# Patient Record
Sex: Female | Born: 1980 | Race: Black or African American | Hispanic: No | Marital: Married | Smoking: Never smoker
Health system: Southern US, Community
[De-identification: ages and names within clinical notes are randomized; demographics above are authoritative.]

## PROBLEM LIST (undated history)

## (undated) DIAGNOSIS — D071 Carcinoma in situ of vulva: Secondary | ICD-10-CM

## (undated) DIAGNOSIS — E669 Obesity, unspecified: Secondary | ICD-10-CM

## (undated) DIAGNOSIS — Z9109 Other allergy status, other than to drugs and biological substances: Secondary | ICD-10-CM

## (undated) DIAGNOSIS — D649 Anemia, unspecified: Secondary | ICD-10-CM

## (undated) DIAGNOSIS — G510 Bell's palsy: Secondary | ICD-10-CM

## (undated) DIAGNOSIS — F419 Anxiety disorder, unspecified: Secondary | ICD-10-CM

## (undated) HISTORY — PX: EYE SURGERY: SHX253

## (undated) HISTORY — DX: Obesity, unspecified: E66.9

## (undated) HISTORY — PX: VULVA /PERINEUM BIOPSY: SHX319

## (undated) HISTORY — DX: Carcinoma in situ of vulva: D07.1

---

## 2001-09-17 ENCOUNTER — Ambulatory Visit (HOSPITAL_COMMUNITY): Admission: RE | Admit: 2001-09-17 | Discharge: 2001-09-17 | Payer: Self-pay | Admitting: Ophthalmology

## 2003-11-22 ENCOUNTER — Emergency Department (HOSPITAL_COMMUNITY): Admission: EM | Admit: 2003-11-22 | Discharge: 2003-11-22 | Payer: Self-pay | Admitting: Family Medicine

## 2004-01-19 ENCOUNTER — Emergency Department (HOSPITAL_COMMUNITY): Admission: EM | Admit: 2004-01-19 | Discharge: 2004-01-19 | Payer: Self-pay | Admitting: Family Medicine

## 2004-03-09 ENCOUNTER — Emergency Department (HOSPITAL_COMMUNITY): Admission: EM | Admit: 2004-03-09 | Discharge: 2004-03-09 | Payer: Self-pay | Admitting: Family Medicine

## 2004-04-17 ENCOUNTER — Emergency Department (HOSPITAL_COMMUNITY): Admission: EM | Admit: 2004-04-17 | Discharge: 2004-04-17 | Payer: Self-pay | Admitting: Family Medicine

## 2005-11-27 ENCOUNTER — Emergency Department (HOSPITAL_COMMUNITY): Admission: EM | Admit: 2005-11-27 | Discharge: 2005-11-27 | Payer: Self-pay | Admitting: Emergency Medicine

## 2005-12-11 ENCOUNTER — Emergency Department (HOSPITAL_COMMUNITY): Admission: EM | Admit: 2005-12-11 | Discharge: 2005-12-11 | Payer: Self-pay | Admitting: Emergency Medicine

## 2006-09-17 ENCOUNTER — Emergency Department (HOSPITAL_COMMUNITY): Admission: EM | Admit: 2006-09-17 | Discharge: 2006-09-17 | Payer: Self-pay | Admitting: Emergency Medicine

## 2007-09-28 ENCOUNTER — Emergency Department (HOSPITAL_COMMUNITY): Admission: EM | Admit: 2007-09-28 | Discharge: 2007-09-28 | Payer: Self-pay | Admitting: Family Medicine

## 2011-01-18 DIAGNOSIS — Z0000 Encounter for general adult medical examination without abnormal findings: Secondary | ICD-10-CM | POA: Insufficient documentation

## 2012-07-26 DIAGNOSIS — N390 Urinary tract infection, site not specified: Secondary | ICD-10-CM | POA: Insufficient documentation

## 2013-05-25 ENCOUNTER — Ambulatory Visit: Payer: Self-pay | Admitting: Gynecologic Oncology

## 2013-06-07 ENCOUNTER — Ambulatory Visit: Payer: Self-pay | Admitting: Gynecologic Oncology

## 2013-06-28 ENCOUNTER — Inpatient Hospital Stay: Payer: Self-pay

## 2013-06-28 LAB — CBC WITH DIFFERENTIAL/PLATELET
BASOS PCT: 0.5 %
Basophil #: 0 10*3/uL (ref 0.0–0.1)
EOS PCT: 0.9 %
Eosinophil #: 0.1 10*3/uL (ref 0.0–0.7)
HCT: 34.6 % — AB (ref 35.0–47.0)
HGB: 11.5 g/dL — ABNORMAL LOW (ref 12.0–16.0)
LYMPHS PCT: 21.9 %
Lymphocyte #: 1.4 10*3/uL (ref 1.0–3.6)
MCH: 29.6 pg (ref 26.0–34.0)
MCHC: 33.4 g/dL (ref 32.0–36.0)
MCV: 89 fL (ref 80–100)
MONO ABS: 0.4 x10 3/mm (ref 0.2–0.9)
Monocyte %: 6.8 %
Neutrophil #: 4.5 10*3/uL (ref 1.4–6.5)
Neutrophil %: 69.9 %
Platelet: 242 10*3/uL (ref 150–440)
RBC: 3.89 10*6/uL (ref 3.80–5.20)
RDW: 15.3 % — AB (ref 11.5–14.5)
WBC: 6.5 10*3/uL (ref 3.6–11.0)

## 2013-06-30 LAB — CBC WITH DIFFERENTIAL/PLATELET
BASOS ABS: 0 10*3/uL (ref 0.0–0.1)
BASOS PCT: 0.3 %
EOS ABS: 0 10*3/uL (ref 0.0–0.7)
EOS PCT: 0.5 %
HCT: 34.4 % — AB (ref 35.0–47.0)
HGB: 11.3 g/dL — AB (ref 12.0–16.0)
LYMPHS ABS: 1.2 10*3/uL (ref 1.0–3.6)
Lymphocyte %: 16.2 %
MCH: 29.4 pg (ref 26.0–34.0)
MCHC: 32.7 g/dL (ref 32.0–36.0)
MCV: 90 fL (ref 80–100)
Monocyte #: 0.5 x10 3/mm (ref 0.2–0.9)
Monocyte %: 6.3 %
NEUTROS PCT: 76.7 %
Neutrophil #: 5.6 10*3/uL (ref 1.4–6.5)
PLATELETS: 223 10*3/uL (ref 150–440)
RBC: 3.84 10*6/uL (ref 3.80–5.20)
RDW: 14.9 % — AB (ref 11.5–14.5)
WBC: 7.3 10*3/uL (ref 3.6–11.0)

## 2013-07-02 LAB — HEMATOCRIT: HCT: 31.8 % — AB (ref 35.0–47.0)

## 2013-07-10 ENCOUNTER — Emergency Department: Payer: Self-pay | Admitting: Emergency Medicine

## 2013-07-10 LAB — URINALYSIS, COMPLETE
BILIRUBIN, UR: NEGATIVE
Glucose,UR: NEGATIVE mg/dL (ref 0–75)
Ketone: NEGATIVE
Nitrite: NEGATIVE
PROTEIN: NEGATIVE
Ph: 7 (ref 4.5–8.0)
SPECIFIC GRAVITY: 1.011 (ref 1.003–1.030)

## 2013-07-10 LAB — CBC
HCT: 37 % (ref 35.0–47.0)
HGB: 12.3 g/dL (ref 12.0–16.0)
MCH: 29.8 pg (ref 26.0–34.0)
MCHC: 33.3 g/dL (ref 32.0–36.0)
MCV: 89 fL (ref 80–100)
Platelet: 380 10*3/uL (ref 150–440)
RBC: 4.14 10*6/uL (ref 3.80–5.20)
RDW: 14.3 % (ref 11.5–14.5)
WBC: 5 10*3/uL (ref 3.6–11.0)

## 2013-07-10 LAB — COMPREHENSIVE METABOLIC PANEL
ALBUMIN: 2.9 g/dL — AB (ref 3.4–5.0)
ANION GAP: 7 (ref 7–16)
AST: 24 U/L (ref 15–37)
Alkaline Phosphatase: 105 U/L
BUN: 15 mg/dL (ref 7–18)
Bilirubin,Total: 0.3 mg/dL (ref 0.2–1.0)
CALCIUM: 9.3 mg/dL (ref 8.5–10.1)
CHLORIDE: 106 mmol/L (ref 98–107)
CO2: 28 mmol/L (ref 21–32)
CREATININE: 0.89 mg/dL (ref 0.60–1.30)
EGFR (Non-African Amer.): >60
Glucose: 82 mg/dL (ref 65–99)
OSMOLALITY: 281 (ref 275–301)
Potassium: 4.1 mmol/L (ref 3.5–5.1)
SGPT (ALT): 28 U/L (ref 12–78)
Sodium: 141 mmol/L (ref 136–145)
Total Protein: 7.5 g/dL (ref 6.4–8.2)

## 2013-11-28 ENCOUNTER — Encounter (HOSPITAL_COMMUNITY): Payer: Self-pay | Admitting: Emergency Medicine

## 2013-11-28 ENCOUNTER — Emergency Department (INDEPENDENT_AMBULATORY_CARE_PROVIDER_SITE_OTHER)
Admission: EM | Admit: 2013-11-28 | Discharge: 2013-11-28 | Disposition: A | Discharge status: Home / Self Care | Payer: 59 | Source: Home / Self Care

## 2013-11-28 DIAGNOSIS — L03113 Cellulitis of right upper limb: Secondary | ICD-10-CM

## 2013-11-28 MED ORDER — SULFAMETHOXAZOLE-TRIMETHOPRIM 800-160 MG PO TABS: 2.0000 | ORAL_TABLET | Freq: Two times a day (BID) | ORAL | Status: DC

## 2013-11-28 MED ORDER — FLUCONAZOLE 100 MG PO TABS: 100.0000 mg | ORAL_TABLET | Freq: Every day | ORAL | Status: DC

## 2013-11-28 MED ORDER — CEPHALEXIN 500 MG PO CAPS: 500.0000 mg | ORAL_CAPSULE | Freq: Four times a day (QID) | ORAL | Status: DC

## 2013-11-28 NOTE — ED Notes (Signed)
C/O RIGHT GREAT TOE PAIN AND SWELLING.  STATES STUBBED TOE ONE WEEK AGO AND STILL HAVING PAIN AND NOTICED CLEAR DRAINAGE FROM TOE AFTER CUTTING TOE NAIL.

## 2013-11-28 NOTE — Discharge Instructions (Signed)
Thank you for coming in today. Take Keflex 4 times daily for one week If not improving take Bactrim as well Take fluconazole if he develops yeast infection Come back as needed  Cellulitis Cellulitis is an infection of the skin and the tissue beneath it. The infected area is usually red and tender. Cellulitis occurs most often in the arms and lower legs.  CAUSES  Cellulitis is caused by bacteria that enter the skin through cracks or cuts in the skin. The most common types of bacteria that cause cellulitis are staphylococci and streptococci. SIGNS AND SYMPTOMS   Redness and warmth.  Swelling.  Tenderness or pain.  Fever. DIAGNOSIS  Your health care provider can usually determine what is wrong based on a physical exam. Blood tests may also be done. TREATMENT  Treatment usually involves taking an antibiotic medicine. HOME CARE INSTRUCTIONS   Take your antibiotic medicine as directed by your health care provider. Finish the antibiotic even if you start to feel better.  Keep the infected arm or leg elevated to reduce swelling.  Apply a warm cloth to the affected area up to 4 times per day to relieve pain.  Take medicines only as directed by your health care provider.  Keep all follow-up visits as directed by your health care provider. SEEK MEDICAL CARE IF:   You notice red streaks coming from the infected area.  Your red area gets larger or turns dark in color.  Your bone or joint underneath the infected area becomes painful after the skin has healed.  Your infection returns in the same area or another area.  You notice a swollen bump in the infected area.  You develop new symptoms.  You have a fever. SEEK IMMEDIATE MEDICAL CARE IF:   You feel very sleepy.  You develop vomiting or diarrhea.  You have a general ill feeling (malaise) with muscle aches and pains. MAKE SURE YOU:   Understand these instructions.  Will watch your condition.  Will get help right away  if you are not doing well or get worse. Document Released: 10/03/2004 Document Revised: 05/10/2013 Document Reviewed: 03/11/2011 [facility] Patient Information 2015 [employer], [place]. This information is not intended to replace advice given to you by your health care provider. Make sure you discuss any questions you have with your health care provider.

## 2013-11-28 NOTE — ED Provider Notes (Signed)
Catherine Montgomery is a 33 y.o. female who presents to Urgent Care today for right great toenail erythema and tenderness. One week ago patient stepped her right great toe. The last several days she's had redness and tenderness at the angle of cuticle on the lateral side. No fevers or chills. No drug allergies. Patient is currently breast-feeding.   History reviewed. No pertinent past medical history. History reviewed. No pertinent past surgical history. History  Substance Use Topics  . Smoking status: Never Smoker   . Smokeless tobacco: Not on file  . Alcohol Use: No   ROS as above Medications: No current facility-administered medications for this encounter.   Current Outpatient Prescriptions  Medication Sig Dispense Refill  . cephALEXin (KEFLEX) 500 MG capsule Take 1 capsule (500 mg total) by mouth 4 (four) times daily. 28 capsule 0  . fluconazole (DIFLUCAN) 100 MG tablet Take 1 tablet (100 mg total) by mouth daily. 10 tablet 0  . sulfamethoxazole-trimethoprim (SEPTRA DS) 800-160 MG per tablet Take 2 tablets by mouth 2 (two) times daily. 28 tablet 0   No Known Allergies   Exam:  BP 143/88 mmHg  Pulse 78  Temp(Src) 98.2 F (36.8 C) (Oral)  Resp 16  SpO2 98%  LMP  (LMP Unknown) Gen: Well NAD Right great toe:  Lateral border erythematous and tender. No discharge. No fluctuance. No results found for this or any previous visit (from the past 24 hour(s)). No results found.  Assessment and Plan: 33 y.o. female with early paronychia versus cellulitis. Treatment with Keflex. If not improved start Bactrim. Fluconazole for yeast as needed. Would like to avoid Bactrim if possible because patient is currently breast-feeding however will use his medication if needed.  Discussed warning signs or symptoms. Please see discharge instructions. Patient expresses understanding.     Rodolph BongEvan S Mercy Leppla, MD 11/28/13 [id number]

## 2014-04-30 NOTE — Op Note (Signed)
PATIENT NAME:  Catherine Montgomery, [name] MR#:  [id number] DATE OF BIRTH:  April 20, 1980  DATE OF PROCEDURE:  07/01/2013  PREOPERATIVE DIAGNOSES:  741. A 34 year old G1 at 40 weeks, 2 days gestation.  2. Failed induction for oligohydramnios.   POSTOPERATIVE DIAGNOSES:  401. A 34 year old G1 at 40 weeks, 2 days gestation.  2. Failed induction for oligohydramnios.   OPERATION PERFORMED: Primary low transverse C-section via Pfannenstiel skin incision.   ANESTHESIA USED:  Epidural.   PRIMARY SURGEON: Dr. Vena AustriaAndreas Hilman Kissling.   ASSISTANT:  Dr. Adria Devonarrie Klett.   ESTIMATED BLOOD LOSS: 800 mL.  OPERATIVE FLUIDS: 900 mL of crystalloid.   PREOPERATIVE ANTIBIOTICS: Two grams of Ancef.   DRAINS OR TUBES: Foley to gravity drainage, On-Q catheter system.   IMPLANTS: None.   SPECIMENS REMOVED: None.   COMPLICATIONS: None.   INTRAOPERATIVE FINDINGS: Normal uterus, tubes, and ovaries. Delivery resulted in the birth of a liveborn female infant weighing 3600 grams; 7 pounds, 15 ounces. Apgars 8 and 9. Patient condition following the procedure stable.   PROCEDURE IN DETAIL: Risks, benefits, and alternatives of the procedure were discussed with the patient prior to proceeding to the operating room. The patient was taken to the operating room where epidural anesthesia was dosed for C-section. The patient was positioned in the supine position, prepped and draped in the usual sterile fashion. A timeout was performed. The level of anesthetic was checked and noted to be adequate prior to proceeding with the case. A Pfannenstiel skin incision was made 2 cm above the pubic symphysis, carried down sharply to level of the rectus fascia utilizing a knife. The fascia was incised in the midline and then extended using Mayo scissors. The superior border of the rectus fascia was grasped with 2 Kocher clamps. The underlying rectus muscles were dissected off the fascia bluntly and the median raphe incised using Mayo scissors. The inferior  border of the rectus fascia was dissected off the rectus muscle in a similar fashion. The midline was identified and the peritoneal cavity was entered bluntly. The peritoneal incision was extended using manual traction on bladder. Blade was placed, displacing the bladder caudally. A low transverse incision was scored on the uterus and the uterus was entered bluntly using the operator's finger. The hysterotomy incision was extended using manual traction. Clear fluid was encountered. The operator's hand was placed into the hysterotomy incision. The infant was noted to be in the OA position. The vertex was grasped, flexed, brought to the incision, delivered atraumatically using fundal pressure. The remainder of the body delivered with ease. The infant was suctioned, cord was clamped and cut, and the infant was passed to the awaiting pediatricians. The placenta was delivered using manual extraction. The uterus was exteriorized, wiped clean of clots and debris using 2 moist laps. The hysterotomy incision was then closed using a 2-layer closure of 0 Vicryl with the 1st being a running locked, the 2nd a vertical imbricating. The uterus was returned to the abdomen.  Perineal gutters were wiped clean of clots and debris. Hysterotomy incision was reinspected, noted to be hemostatic. The rectus muscles were reapproximated in the midline using a 2-0 Vicryl mattress stitch. The On-Q catheters were then placed subfascially per the usual protocol. The fascia was closed using a looped #1 PDS in a running fashion. Subcutaneous tissue was irrigated.  The subcutaneous dead space was closed using a T53 needle of 2-0 chromic using interrupted sutures. Skin was closed using Insorb staples. The On-Q catheters were then bolused. Sponge, needle, and  instrument counts were correct x 2. The patient tolerated the procedure well and was taken to the recovery room in stable condition.    ____________________________ Florina Ou. Bonney Aid,  MD ams:dd D: 07/08/2013 16:04:06 ET T: 07/09/2013 02:25:59 ET JOB#: [id number]  cc: Florina Ou. Bonney Aid, MD, <Dictator> Lorrene Reid MD ELECTRONICALLY SIGNED 07/10/2013 8:08

## 2014-05-17 NOTE — H&P (Signed)
L&D Evaluation:  History Expanded:  HPI 34 yo G3P0020 at 39weeks 6days, with a previously unremarkable pregnancy who has been followed for the last few weeks with AFIs due to low fluid. 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 She also has been followed for VIN3. she HAS HAD LOW FLUID FOR TWO WEEKS, TODAY IT WAS 6 CM AND THE nst WAS REACTIVE. The fluid has been dropping slowly and given her due date the decision was made to induce.  She received her tdap 05/14/2013, she is BPOS/RUBI/VZI. she was diagnosed with anemia at 28 weeks. She is GBS neg. the wide local excision may be done PP depending on how the delivery goes.   Term 0   PreTerm 0   Abortion 2   Living 0   Blood Type (Maternal) B positive   Group B Strep Results Maternal (Result >5wks must be treated as unknown) negative   Maternal HIV Negative   Maternal Syphilis Ab Nonreactive   Maternal Varicella Immune   Rubella Results (Maternal) immune   Maternal [facility]Dap Immune   [facility]   Presents with OLIGOHYDRAMNIOS   Patient's Medical History No Chronic Illness  obesity   Patient's Surgical History none   Medications Pre Natal Vitamins   Allergies NKDA   Social History none   Family History Non-Contributory   Current Prenatal Course Notable For VIN 3, low flujid, obesity   ROS:  ROS All systems were reviewed.  HEENT, CNS, GI, GU, Respiratory, CV, Renal and Musculoskeletal systems were found to be normal.   Exam:  Vital Signs stable   Urine Protein not completed   General no apparent distress   Mental Status clear   Chest clear   Heart normal sinus rhythm   Abdomen gravid, non-tender   Estimated Fetal Weight Average for gestational age   Fetal Position v   Fundal Height term   Back no CVAT   Edema no edema   Pelvic no external lesions, very thin low uterine segmant, unable to reach the cervix as VERY posterior   Mebranes Intact   FHT normal  rate with no decels, CAT 1   Fetal Heart Rate 140   Ucx irregular   Skin dry   Lymph no lymphadenopathy   Impression:  Impression oligohydramnios   Plan:  Plan UA, EFM/NST   Comments start cytotec and then pitocin anticipate SVD   Follow Up Appointment already scheduled. in 6 weeks   Electronic Signatures: Adria DevonKlett, Carrie (MD)  (Signed 22-Jun-15 20:24)  Authored: L&D Evaluation   Last Updated: 22-Jun-15 20:24 by Adria DevonKlett, Carrie (MD)

## 2014-09-14 LAB — OB RESULTS CONSOLE HIV ANTIBODY (ROUTINE TESTING): HIV: NONREACTIVE

## 2014-09-14 LAB — OB RESULTS CONSOLE RPR: RPR: NONREACTIVE

## 2014-09-14 LAB — OB RESULTS CONSOLE PLATELET COUNT: PLATELETS: 307 10*3/uL

## 2014-09-14 LAB — OB RESULTS CONSOLE VARICELLA ZOSTER ANTIBODY, IGG: Varicella: IMMUNE

## 2014-09-14 LAB — OB RESULTS CONSOLE RUBELLA ANTIBODY, IGM: RUBELLA: IMMUNE

## 2014-09-14 LAB — OB RESULTS CONSOLE HEPATITIS B SURFACE ANTIGEN: Hepatitis B Surface Ag: NEGATIVE

## 2014-12-07 ENCOUNTER — Emergency Department
Admission: EM | Admit: 2014-12-07 | Discharge: 2014-12-07 | Disposition: A | Discharge status: Home / Self Care | Payer: 59 | Attending: Emergency Medicine | Admitting: Emergency Medicine

## 2014-12-07 DIAGNOSIS — O99612 Diseases of the digestive system complicating pregnancy, second trimester: Secondary | ICD-10-CM | POA: Diagnosis not present

## 2014-12-07 DIAGNOSIS — O210 Mild hyperemesis gravidarum: Secondary | ICD-10-CM | POA: Diagnosis present

## 2014-12-07 DIAGNOSIS — K529 Noninfective gastroenteritis and colitis, unspecified: Secondary | ICD-10-CM | POA: Insufficient documentation

## 2014-12-07 DIAGNOSIS — Z3A20 20 weeks gestation of pregnancy: Secondary | ICD-10-CM | POA: Diagnosis not present

## 2014-12-07 LAB — URINALYSIS COMPLETE WITH MICROSCOPIC (ARMC ONLY)
BILIRUBIN URINE: NEGATIVE
GLUCOSE, UA: NEGATIVE mg/dL
HGB URINE DIPSTICK: NEGATIVE
LEUKOCYTES UA: NEGATIVE
NITRITE: NEGATIVE
Protein, ur: NEGATIVE mg/dL
SPECIFIC GRAVITY, URINE: 1.018 (ref 1.005–1.030)
pH: 7 (ref 5.0–8.0)

## 2014-12-07 LAB — CBC
HCT: 34.9 % — ABNORMAL LOW (ref 35.0–47.0)
Hemoglobin: 11.6 g/dL — ABNORMAL LOW (ref 12.0–16.0)
MCH: 28.8 pg (ref 26.0–34.0)
MCHC: 33.3 g/dL (ref 32.0–36.0)
MCV: 86.6 fL (ref 80.0–100.0)
PLATELETS: 249 10*3/uL (ref 150–440)
RBC: 4.03 MIL/uL (ref 3.80–5.20)
RDW: 13.2 % (ref 11.5–14.5)
WBC: 7.8 10*3/uL (ref 3.6–11.0)

## 2014-12-07 LAB — BASIC METABOLIC PANEL
ANION GAP: 7 (ref 5–15)
BUN: 7 mg/dL (ref 6–20)
CALCIUM: 8.7 mg/dL — AB (ref 8.9–10.3)
CO2: 22 mmol/L (ref 22–32)
Chloride: 106 mmol/L (ref 101–111)
Creatinine, Ser: 0.52 mg/dL (ref 0.44–1.00)
GFR calc Af Amer: >60 mL/min (ref >60)
GLUCOSE: 106 mg/dL — AB (ref 65–99)
POTASSIUM: 3.2 mmol/L — AB (ref 3.5–5.1)
SODIUM: 135 mmol/L (ref 135–145)

## 2014-12-07 LAB — PREGNANCY, URINE: Preg Test, Ur: POSITIVE — AB

## 2014-12-07 MED ORDER — PROMETHAZINE HCL 25 MG PO TABS: 25.0000 mg | ORAL_TABLET | ORAL | Status: DC | PRN

## 2014-12-07 MED ORDER — SODIUM CHLORIDE 0.9 % IV SOLN: Freq: Once | INTRAVENOUS | Status: DC

## 2014-12-07 NOTE — Discharge Instructions (Signed)
Norovirus Infection °A norovirus infection is caused by exposure to a virus in a group of similar viruses (noroviruses). This type of infection causes inflammation in your stomach and intestines (gastroenteritis). Norovirus is the most common cause of gastroenteritis. It also causes food poisoning. °Anyone can get a norovirus infection. It spreads very easily (contagious). You can get it from contaminated food, water, surfaces, or other people. Norovirus is found in the stool or vomit of infected people. You can spread the infection as soon as you feel sick until 2 weeks after you recover.  °Symptoms usually begin within 2 days after you become infected. Most norovirus symptoms affect the digestive system. °CAUSES °Norovirus infection is caused by contact with norovirus. You can catch norovirus if you: °· Eat or drink something contaminated with norovirus. °· Touch surfaces or objects contaminated with norovirus and then put your hand in your mouth. °· Have direct contact with an infected person who has symptoms. °· Share food, drink, or utensils with someone with who is sick with norovirus. °SIGNS AND SYMPTOMS °Symptoms of norovirus may include: °· Nausea. °· Vomiting. °· Diarrhea. °· Stomach cramps. °· Fever. °· Chills. °· Headache. °· Muscle aches. °· Tiredness. °DIAGNOSIS °Your health care provider may suspect norovirus based on your symptoms and physical exam. Your health care provider may also test a sample of your stool or vomit for the virus.  °TREATMENT °There is no specific treatment for norovirus. Most people get better without treatment in about 2 days. °HOME CARE INSTRUCTIONS °· Replace lost fluids by drinking plenty of water or rehydration fluids containing important minerals called electrolytes. This prevents dehydration. Drink enough fluid to keep your urine clear or pale yellow. °· Do not prepare food for others while you are infected. Wait at least 3 days after recovering from the illness to do  that. °PREVENTION  °· Wash your hands often, especially after using the toilet or changing a diaper. °· Wash fruits and vegetables thoroughly before preparing or serving them. °· Throw out any food that a sick person may have touched. °· Disinfect contaminated surfaces immediately after someone in the household has been sick. Use a bleach-based household cleaner. °· Immediately remove and wash soiled clothes or sheets. °SEEK MEDICAL CARE IF: °· Your vomiting, diarrhea, and stomach pain is getting worse. °· Your symptoms of norovirus do not go away after 2-3 days. °SEEK IMMEDIATE MEDICAL CARE IF:  °You develop symptoms of dehydration that do not improve with fluid replacement. This may include: °· Excessive sleepiness. °· Lack of tears. °· Dry mouth. °· Dizziness when standing. °· Weak pulse. °  °This information is not intended to replace advice given to you by your health care provider. Make sure you discuss any questions you have with your health care provider. °  °Document Released: 03/16/2002 Document Revised: 01/14/2014 Document Reviewed: 06/03/2013 °Elsevier Interactive Patient Education ©2016 [employer] Inc. ° °

## 2014-12-07 NOTE — ED Notes (Signed)
Spoke with Dr. Inocencio HomesGayle about patient case.  Verbal orders received.

## 2014-12-07 NOTE — ED Provider Notes (Addendum)
[facility] Emergency Department Provider Note     Time seen: ----------------------------------------- 7:57 PM on 12/07/2014 -----------------------------------------    I have reviewed the triage vital signs and the nursing notes.   HISTORY  Chief Complaint Nausea and Diarrhea    HPI HASANA ALCORTA is a 34 y.o. female who presents ER for nausea vomiting diarrhea since yesterday. Patient was complaining some low back pain as well, thought she might be dehydrated. She was sent from [facility] to receive IV fluids. Patient states she was told earlier she had a fever, but not one currently. Denies any abdominal pain.Patient states she is [redacted] weeks pregnant and she said no sound today that was normal.   History reviewed. No pertinent past medical history.  There are no active problems to display for this patient.   History reviewed. No pertinent past surgical history.  Allergies Review of patient's allergies indicates no known allergies.  Social History Social History  Substance Use Topics  . Smoking status: Never Smoker   . Smokeless tobacco: None  . Alcohol Use: No    Review of Systems Constitutional: Positive for fever Eyes: Negative for visual changes. ENT: Negative for sore throat. Cardiovascular: Negative for chest pain. Respiratory: Negative for shortness of breath. Gastrointestinal: Positive for abdominal pain, vomiting and diarrhea Genitourinary: Negative for dysuria. Musculoskeletal: Negative for back pain. Skin: Negative for rash. Neurological: Negative for headaches, focal weakness or numbness.  10-point ROS otherwise negative.  ____________________________________________   PHYSICAL EXAM:  VITAL SIGNS: ED Triage Vitals  Enc Vitals Group     BP 12/07/14 1744 116/74 mmHg     Pulse Rate 12/07/14 1744 107     Resp 12/07/14 1744 16     Temp 12/07/14 1744 99.4 F (37.4 C)     Temp Source 12/07/14 1744 Oral   SpO2 12/07/14 1744 99 %     Weight 12/07/14 1744 240 lb (108.863 kg)     Height 12/07/14 1744  (1.727 m)     Head Cir --      Peak Flow --      Pain Score 12/07/14 1744 6     Pain Loc --      Pain Edu? --      Excl. in GC? --     Constitutional: Alert and oriented. Well appearing and in no distress. Eyes: Conjunctivae are normal. PERRL. Normal extraocular movements. ENT   Head: Normocephalic and atraumatic.   Nose: No congestion/rhinnorhea.   Mouth/Throat: Mucous membranes are moist.   Neck: No stridor. Cardiovascular: Normal rate, regular rhythm. Normal and symmetric distal pulses are present in all extremities. No murmurs, rubs, or gallops. Respiratory: Normal respiratory effort without tachypnea nor retractions. Breath sounds are clear and equal bilaterally. No wheezes/rales/rhonchi. Gastrointestinal: Soft and nontender. No distention. No abdominal bruits.  Musculoskeletal: Nontender with normal range of motion in all extremities. No joint effusions.  No lower extremity tenderness nor edema. Neurologic:  Normal speech and language. No gross focal neurologic deficits are appreciated. Speech is normal. No gait instability. Skin:  Skin is warm, dry and intact. No rash noted. Psychiatric: Mood and affect are normal. Speech and behavior are normal. Patient exhibits appropriate insight and judgment. ____________________________________________  ED COURSE:  Pertinent labs & imaging results that were available during my care of the patient were reviewed by me and considered in my medical decision making (see chart for details). Patient is in no acute distress, will receive IV fluids basic labs. ____________________________________________  LABS (pertinent positives/negatives)  Labs Reviewed  CBC - Abnormal; Notable for the following:    Hemoglobin 11.6 (*)    HCT 34.9 (*)    All other components within normal limits  BASIC METABOLIC PANEL - Abnormal; Notable for  the following:    Potassium 3.2 (*)    Glucose, Bld 106 (*)    Calcium 8.7 (*)    All other components within normal limits  URINALYSIS COMPLETEWITH MICROSCOPIC ([facility] ONLY) - Abnormal; Notable for the following:    Color, Urine YELLOW (*)    APPearance HAZY (*)    Ketones, ur 2+ (*)    Bacteria, UA RARE (*)    Squamous Epithelial / LPF 0-5 (*)    All other components within normal limits  PREGNANCY, URINE - Abnormal; Notable for the following:    Preg Test, Ur POSITIVE (*)    All other components within normal limits   ____________________________________________  FINAL ASSESSMENT AND PLAN  Gastroenteritis, second trimester pregnancy  Plan: Patient with labs and imaging as dictated above. Patient has received IV fluids and antiemetics. Patient currently feeling better, stable for discharge with oral antiemetics. Urine does not look infected, she'll be discharged with Phenergan to take as needed   Emily FilbertWilliams, Jonathan E, MD   Emily FilbertJonathan E Williams, MD 12/07/14 1958  Emily FilbertJonathan E Williams, MD 12/07/14 2147

## 2014-12-07 NOTE — ED Notes (Signed)
a 

## 2014-12-07 NOTE — ED Notes (Signed)
Patient comes with complaints of nausea, vomiting, and diarrhea since yesterday.

## 2014-12-07 NOTE — ED Notes (Signed)
Patient discharged to home per MD order. Patient in stable condition, and deemed medically cleared by ED provider for discharge. Discharge instructions reviewed with patient/family using "Teach Back"; verbalized understanding of medication education and administration, and information about follow-up care. Denies further concerns. ° °

## 2014-12-07 NOTE — ED Notes (Signed)
Pt in with co n.v.d since yest, also having dysuria, pt able to drink fluids at this time.

## 2015-04-01 LAB — OB RESULTS CONSOLE GBS: GBS: NEGATIVE

## 2015-04-17 ENCOUNTER — Encounter
Admission: RE | Admit: 2015-04-17 | Discharge: 2015-04-17 | Disposition: A | Discharge status: Home / Self Care | Payer: 59 | Source: Ambulatory Visit | Attending: Obstetrics and Gynecology | Admitting: Obstetrics and Gynecology

## 2015-04-17 HISTORY — DX: Bell's palsy: G51.0

## 2015-04-17 HISTORY — DX: Anxiety disorder, unspecified: F41.9

## 2015-04-17 HISTORY — DX: Anemia, unspecified: D64.9

## 2015-04-17 HISTORY — DX: Other allergy status, other than to drugs and biological substances: Z91.09

## 2015-04-17 LAB — CBC
HCT: 32.9 % — ABNORMAL LOW (ref 35.0–47.0)
HEMOGLOBIN: 11 g/dL — AB (ref 12.0–16.0)
MCH: 28.9 pg (ref 26.0–34.0)
MCHC: 33.5 g/dL (ref 32.0–36.0)
MCV: 86.2 fL (ref 80.0–100.0)
Platelets: 213 10*3/uL (ref 150–440)
RBC: 3.81 MIL/uL (ref 3.80–5.20)
RDW: 14.3 % (ref 11.5–14.5)
WBC: 5.3 10*3/uL (ref 3.6–11.0)

## 2015-04-17 LAB — TYPE AND SCREEN
ABO/RH(D): B POS
ANTIBODY SCREEN: NEGATIVE
EXTEND SAMPLE REASON: UNDETERMINED

## 2015-04-17 LAB — RAPID HIV SCREEN (HIV 1/2 AB+AG)
HIV 1/2 ANTIBODIES: NONREACTIVE
HIV-1 P24 ANTIGEN - HIV24: NONREACTIVE

## 2015-04-17 NOTE — Patient Instructions (Signed)
  Your procedure is scheduled on: 04/18/15 5:30 am Report to 3rd floor labor and delivery.   Remember: Instructions that are not followed completely may result in serious medical risk, up to and including death, or upon the discretion of your surgeon and anesthesiologist your surgery may need to be rescheduled.    __x__ 1. Do not eat food or drink liquids after midnight. No gum chewing or hard candies.     ____ 2. No Alcohol for 24 hours before or after surgery.   ____ 3. Bring all medications with you on the day of surgery if instructed.    __x__ 4. Notify your doctor if there is any change in your medical condition     (cold, fever, infections).     Do not wear jewelry, make-up, hairpins, clips or nail polish.  Do not wear lotions, powders, or perfumes. You may wear deodorant.  Do not shave 48 hours prior to surgery. Men may shave face and neck.  Do not bring valuables to the hospital.    [facility] belongings or valuables.               Contacts, dentures or bridgework may not be worn into surgery.  Leave your suitcase in the car. After surgery it may be brought to your room.  For patients admitted to the hospital, discharge time is determined by your                treatment team.   Patients discharged the day of surgery will not be allowed to drive home.   Please read over the following fact sheets that you were given:      ____ Take these medicines the morning of surgery with A SIP OF WATER:    1. None  2.   3.   4.  5.  6.  ____ Fleet Enema (as directed)   _x___ Use CHG Wipes as directed  ____ Use inhalers on the day of surgery  ____ Stop metformin 2 days prior to surgery    ____ Take 1/2 of usual insulin dose the night before surgery and none on the morning of surgery.   ____ Stop Coumadin/Plavix/aspirin on   ____ Stop Anti-inflammatories on    ____ Stop supplements until after surgery.    ____ Bring C-Pap to the hospital.

## 2015-04-18 ENCOUNTER — Inpatient Hospital Stay
Admission: RE | Admit: 2015-04-18 | Discharge: 2015-04-21 | DRG: 765 | Disposition: A | Discharge status: Home / Self Care | Payer: 59 | Source: Ambulatory Visit | Attending: Obstetrics and Gynecology | Admitting: Obstetrics and Gynecology

## 2015-04-18 ENCOUNTER — Inpatient Hospital Stay: Payer: 59 | Admitting: Anesthesiology

## 2015-04-18 ENCOUNTER — Encounter: Payer: Self-pay | Admitting: *Deleted

## 2015-04-18 ENCOUNTER — Encounter
Admission: RE | Disposition: A | Discharge status: Home / Self Care | Payer: Self-pay | Source: Ambulatory Visit | Attending: Obstetrics and Gynecology

## 2015-04-18 DIAGNOSIS — Z6839 Body mass index (BMI) 39.0-39.9, adult: Secondary | ICD-10-CM

## 2015-04-18 DIAGNOSIS — Z3A39 39 weeks gestation of pregnancy: Secondary | ICD-10-CM

## 2015-04-18 DIAGNOSIS — E669 Obesity, unspecified: Secondary | ICD-10-CM | POA: Diagnosis present

## 2015-04-18 DIAGNOSIS — O34211 Maternal care for low transverse scar from previous cesarean delivery: Principal | ICD-10-CM | POA: Diagnosis present

## 2015-04-18 DIAGNOSIS — O9081 Anemia of the puerperium: Secondary | ICD-10-CM | POA: Diagnosis not present

## 2015-04-18 DIAGNOSIS — O99344 Other mental disorders complicating childbirth: Secondary | ICD-10-CM | POA: Diagnosis present

## 2015-04-18 DIAGNOSIS — D62 Acute posthemorrhagic anemia: Secondary | ICD-10-CM | POA: Diagnosis not present

## 2015-04-18 DIAGNOSIS — F419 Anxiety disorder, unspecified: Secondary | ICD-10-CM | POA: Diagnosis present

## 2015-04-18 DIAGNOSIS — Z98891 History of uterine scar from previous surgery: Secondary | ICD-10-CM

## 2015-04-18 DIAGNOSIS — O99214 Obesity complicating childbirth: Secondary | ICD-10-CM | POA: Diagnosis present

## 2015-04-18 LAB — ABO/RH: ABO/RH(D): B POS

## 2015-04-18 LAB — RPR: RPR Ser Ql: NONREACTIVE

## 2015-04-18 SURGERY — Surgical Case
Anesthesia: Spinal

## 2015-04-18 MED ORDER — LACTATED RINGERS IV SOLN
INTRAVENOUS | Status: DC
  Administered 2015-04-19: 02:00:00 via INTRAVENOUS

## 2015-04-18 MED ORDER — OXYTOCIN 10 UNIT/ML IJ SOLN
2.5000 [IU]/h | INTRAVENOUS | Status: DC
  Filled 2015-04-18: qty 4

## 2015-04-18 MED ORDER — SENNOSIDES-DOCUSATE SODIUM 8.6-50 MG PO TABS
2.0000 | ORAL_TABLET | ORAL | Status: DC
  Administered 2015-04-20 – 2015-04-21 (×2): 2 via ORAL
  Filled 2015-04-18 (×2): qty 2

## 2015-04-18 MED ORDER — NALBUPHINE HCL 10 MG/ML IJ SOLN: 5.0000 mg | INTRAMUSCULAR | Status: DC | PRN

## 2015-04-18 MED ORDER — ONDANSETRON HCL 4 MG/2ML IJ SOLN: 4.0000 mg | Freq: Three times a day (TID) | INTRAMUSCULAR | Status: DC | PRN

## 2015-04-18 MED ORDER — METOCLOPRAMIDE HCL 5 MG/ML IJ SOLN
INTRAMUSCULAR | Status: DC | PRN
  Administered 2015-04-18: 10 mg via INTRAVENOUS

## 2015-04-18 MED ORDER — CITRIC ACID-SODIUM CITRATE 334-500 MG/5ML PO SOLN
30.0000 mL | ORAL | Status: AC
  Administered 2015-04-18: 30 mL via ORAL
  Filled 2015-04-18: qty 15

## 2015-04-18 MED ORDER — BUPIVACAINE HCL (PF) 0.5 % IJ SOLN
5.0000 mL | Freq: Once | INTRAMUSCULAR | Status: DC
  Filled 2015-04-18: qty 30

## 2015-04-18 MED ORDER — ONDANSETRON HCL 4 MG/2ML IJ SOLN
INTRAMUSCULAR | Status: DC | PRN
Start: 2015-04-18 — End: 2015-04-18
  Administered 2015-04-18: 4 mg via INTRAVENOUS

## 2015-04-18 MED ORDER — SIMETHICONE 80 MG PO CHEW
80.0000 mg | CHEWABLE_TABLET | Freq: Three times a day (TID) | ORAL | Status: DC
  Administered 2015-04-18 – 2015-04-21 (×9): 80 mg via ORAL
  Filled 2015-04-18 (×9): qty 1

## 2015-04-18 MED ORDER — FENTANYL CITRATE (PF) 100 MCG/2ML IJ SOLN
INTRAMUSCULAR | Status: DC | PRN
  Administered 2015-04-18: 50 ug via INTRAVENOUS

## 2015-04-18 MED ORDER — SODIUM CHLORIDE 0.9% FLUSH: 3.0000 mL | INTRAVENOUS | Status: DC | PRN

## 2015-04-18 MED ORDER — DIPHENHYDRAMINE HCL 25 MG PO CAPS: 25.0000 mg | ORAL_CAPSULE | Freq: Four times a day (QID) | ORAL | Status: DC | PRN

## 2015-04-18 MED ORDER — MEPERIDINE HCL 25 MG/ML IJ SOLN: 6.2500 mg | INTRAMUSCULAR | Status: DC | PRN

## 2015-04-18 MED ORDER — LACTATED RINGERS IV SOLN
INTRAVENOUS | Status: DC
  Administered 2015-04-18 (×2): via INTRAVENOUS

## 2015-04-18 MED ORDER — CEFAZOLIN SODIUM-DEXTROSE 2-4 GM/100ML-% IV SOLN
2.0000 g | INTRAVENOUS | Status: AC
  Administered 2015-04-18: 2 g via INTRAVENOUS
  Filled 2015-04-18: qty 100

## 2015-04-18 MED ORDER — KETOROLAC TROMETHAMINE 30 MG/ML IJ SOLN
30.0000 mg | Freq: Four times a day (QID) | INTRAMUSCULAR | Status: AC | PRN
  Administered 2015-04-18 (×2): 30 mg via INTRAVENOUS
  Filled 2015-04-18 (×3): qty 1

## 2015-04-18 MED ORDER — NALBUPHINE HCL 10 MG/ML IJ SOLN: 5.0000 mg | Freq: Once | INTRAMUSCULAR | Status: DC | PRN

## 2015-04-18 MED ORDER — KETOROLAC TROMETHAMINE 60 MG/2ML IM SOLN
INTRAMUSCULAR | Status: DC | PRN
  Administered 2015-04-18: 30 mg via INTRAMUSCULAR

## 2015-04-18 MED ORDER — FENTANYL CITRATE (PF) 100 MCG/2ML IJ SOLN: 25.0000 ug | INTRAMUSCULAR | Status: DC | PRN

## 2015-04-18 MED ORDER — MORPHINE SULFATE (PF) 0.5 MG/ML IJ SOLN
INTRAMUSCULAR | Status: DC | PRN
  Administered 2015-04-18: 200 ug via EPIDURAL

## 2015-04-18 MED ORDER — OXYTOCIN 40 UNITS IN LACTATED RINGERS INFUSION - SIMPLE MED
INTRAVENOUS | Status: AC
  Filled 2015-04-18: qty 1000

## 2015-04-18 MED ORDER — BUPIVACAINE HCL (PF) 0.75 % IJ SOLN
INTRAMUSCULAR | Status: DC | PRN
  Administered 2015-04-18: 15 mg via INTRATHECAL

## 2015-04-18 MED ORDER — BUPIVACAINE HCL (PF) 0.5 % IJ SOLN: 5.0000 mL | Freq: Once | INTRAMUSCULAR | Status: DC

## 2015-04-18 MED ORDER — LANOLIN HYDROUS EX OINT: 1.0000 "application " | TOPICAL_OINTMENT | CUTANEOUS | Status: DC | PRN

## 2015-04-18 MED ORDER — ONDANSETRON HCL 4 MG/2ML IJ SOLN: 4.0000 mg | Freq: Once | INTRAMUSCULAR | Status: DC | PRN

## 2015-04-18 MED ORDER — BUPIVACAINE ON-Q PAIN PUMP (FOR ORDER SET NO CHG)
INJECTION | Status: DC
  Filled 2015-04-18: qty 1

## 2015-04-18 MED ORDER — OXYCODONE HCL 5 MG PO TABS
10.0000 mg | ORAL_TABLET | ORAL | Status: DC | PRN
  Administered 2015-04-19 – 2015-04-21 (×6): 10 mg via ORAL
  Filled 2015-04-18 (×6): qty 2

## 2015-04-18 MED ORDER — IBUPROFEN 600 MG PO TABS
600.0000 mg | ORAL_TABLET | Freq: Four times a day (QID) | ORAL | Status: DC
  Administered 2015-04-18 – 2015-04-21 (×11): 600 mg via ORAL
  Filled 2015-04-18 (×10): qty 1

## 2015-04-18 MED ORDER — OXYTOCIN 40 UNITS IN LACTATED RINGERS INFUSION - SIMPLE MED
INTRAVENOUS | Status: AC
  Administered 2015-04-18: 1000 mL via INTRAVENOUS
  Filled 2015-04-18: qty 1000

## 2015-04-18 MED ORDER — KETOROLAC TROMETHAMINE 30 MG/ML IJ SOLN
30.0000 mg | Freq: Four times a day (QID) | INTRAMUSCULAR | Status: AC | PRN
  Filled 2015-04-18: qty 1

## 2015-04-18 MED ORDER — PRENATAL MULTIVITAMIN CH
1.0000 | ORAL_TABLET | Freq: Every day | ORAL | Status: DC
  Administered 2015-04-19 – 2015-04-21 (×3): 1 via ORAL
  Filled 2015-04-18 (×3): qty 1

## 2015-04-18 MED ORDER — NALOXONE HCL 2 MG/2ML IJ SOSY
1.0000 ug/kg/h | PREFILLED_SYRINGE | INTRAVENOUS | Status: DC | PRN
  Filled 2015-04-18: qty 2

## 2015-04-18 MED ORDER — DEXAMETHASONE SODIUM PHOSPHATE 10 MG/ML IJ SOLN
INTRAMUSCULAR | Status: DC | PRN
  Administered 2015-04-18: 10 mg via INTRAVENOUS

## 2015-04-18 MED ORDER — BUPIVACAINE HCL 0.5 % IJ SOLN
INTRAMUSCULAR | Status: DC | PRN
  Administered 2015-04-18: 10 mL

## 2015-04-18 MED ORDER — LACTATED RINGERS IV SOLN: INTRAVENOUS | Status: DC

## 2015-04-18 MED ORDER — MENTHOL 3 MG MT LOZG
1.0000 | LOZENGE | OROMUCOSAL | Status: DC | PRN
  Filled 2015-04-18: qty 9

## 2015-04-18 MED ORDER — OXYCODONE HCL 5 MG PO TABS
5.0000 mg | ORAL_TABLET | ORAL | Status: DC | PRN
  Administered 2015-04-20 (×2): 5 mg via ORAL
  Filled 2015-04-18 (×2): qty 1

## 2015-04-18 MED ORDER — BUPIVACAINE 0.25 % ON-Q PUMP DUAL CATH 400 ML
400.0000 mL | INJECTION | Status: DC
  Filled 2015-04-18: qty 400

## 2015-04-18 MED ORDER — FERROUS SULFATE 325 (65 FE) MG PO TABS
325.0000 mg | ORAL_TABLET | Freq: Two times a day (BID) | ORAL | Status: DC
  Administered 2015-04-18 – 2015-04-21 (×7): 325 mg via ORAL
  Filled 2015-04-18 (×7): qty 1

## 2015-04-18 MED ORDER — DIBUCAINE 1 % RE OINT: 1.0000 "application " | TOPICAL_OINTMENT | RECTAL | Status: DC | PRN

## 2015-04-18 MED ORDER — PHENYLEPHRINE HCL 10 MG/ML IJ SOLN
INTRAMUSCULAR | Status: DC | PRN
  Administered 2015-04-18 (×4): 100 ug via INTRAVENOUS

## 2015-04-18 MED ORDER — WITCH HAZEL-GLYCERIN EX PADS: 1.0000 "application " | MEDICATED_PAD | CUTANEOUS | Status: DC | PRN

## 2015-04-18 MED ORDER — NALOXONE HCL 0.4 MG/ML IJ SOLN: 0.4000 mg | INTRAMUSCULAR | Status: DC | PRN

## 2015-04-18 MED ORDER — IBUPROFEN 600 MG PO TABS
ORAL_TABLET | ORAL | Status: AC
  Administered 2015-04-18: 600 mg via ORAL
  Filled 2015-04-18: qty 1

## 2015-04-18 MED ORDER — SCOPOLAMINE 1 MG/3DAYS TD PT72: 1.0000 | MEDICATED_PATCH | Freq: Once | TRANSDERMAL | Status: DC

## 2015-04-18 SURGICAL SUPPLY — 30 items
CANISTER SUCT 3000ML (MISCELLANEOUS) ×3 IMPLANT
CATH KIT ON-Q SILVERSOAK 5 (CATHETERS) ×2 IMPLANT
CATH KIT ON-Q SILVERSOAK 5IN (CATHETERS) ×6 IMPLANT
CLOSURE WOUND 1/2 X4 (GAUZE/BANDAGES/DRESSINGS) ×1
DRSG TELFA 3X8 NADH (GAUZE/BANDAGES/DRESSINGS) ×3 IMPLANT
ELECT CAUTERY BLADE 6.4 (BLADE) ×3 IMPLANT
ELECT REM PT RETURN 9FT ADLT (ELECTROSURGICAL)
ELECTRODE REM PT RTRN 9FT ADLT (ELECTROSURGICAL) ×1 IMPLANT
GAUZE SPONGE 4X4 12PLY STRL (GAUZE/BANDAGES/DRESSINGS) ×3 IMPLANT
GLOVE BIO SURGEON STRL SZ7 (GLOVE) ×9 IMPLANT
GLOVE INDICATOR 7.5 STRL GRN (GLOVE) ×9 IMPLANT
GOWN STRL REUS W/ TWL LRG LVL3 (GOWN DISPOSABLE) ×3 IMPLANT
GOWN STRL REUS W/TWL LRG LVL3 (GOWN DISPOSABLE) ×9
LIQUID BAND (GAUZE/BANDAGES/DRESSINGS) ×3 IMPLANT
NS IRRIG 1000ML POUR BTL (IV SOLUTION) ×3 IMPLANT
PACK C SECTION AR (MISCELLANEOUS) ×3 IMPLANT
PAD DRESSING TELFA 3X8 NADH (GAUZE/BANDAGES/DRESSINGS) ×1 IMPLANT
PAD OB MATERNITY 4.3X12.25 (PERSONAL CARE ITEMS) ×6 IMPLANT
PAD PREP 24X41 OB/GYN DISP (PERSONAL CARE ITEMS) ×3 IMPLANT
SPONGE LAP 18X18 5 PK (GAUZE/BANDAGES/DRESSINGS) ×6 IMPLANT
STRIP CLOSURE SKIN 1/2X4 (GAUZE/BANDAGES/DRESSINGS) ×2 IMPLANT
SUT CHROMIC GUT BROWN 0 54 (SUTURE) ×1 IMPLANT
SUT CHROMIC GUT BROWN 0 54IN (SUTURE)
SUT MNCRL 4-0 (SUTURE) ×3
SUT MNCRL 4-0 27XMFL (SUTURE) ×1
SUT PDS AB 1 TP1 96 (SUTURE) ×3 IMPLANT
SUT PLAIN 2 0 XLH (SUTURE) ×1 IMPLANT
SUT VIC AB 0 CT1 36 (SUTURE) ×12 IMPLANT
SUTURE MNCRL 4-0 27XMF (SUTURE) ×1 IMPLANT
SWABSTK COMLB BENZOIN TINCTURE (MISCELLANEOUS) ×3 IMPLANT

## 2015-04-18 NOTE — Op Note (Signed)
Cesarean Section Procedure Note   Catherine Montgomery   04/18/2015   Pre-operative Diagnosis:  term pregnancy,prior c-section - desires repeat  Post-operative Diagnosis:  term pregnancy,prior c-section - desires repeat  Procedure: repeat low-transverse cesarean section via Pfannenstiel incision with double-layer uterine closure  Surgeon: Surgeon(s) and Role:    * Conard Novak, MD - Primary    * [place] Bing, MD - Assisting   Anesthesia: spinal   Findings:  1) normal appearing gravid uterus, fallopian tubes, and ovaries 2) viable female infant   Estimated Blood Loss: 750 mL  Total IV Fluids: 1,500 ml crystalloid  Specimens: none  Complications: no complications  Disposition: PACU - hemodynamically stable.   Maternal Condition: stable   Baby condition / location:  Couplet care / Skin to Skin  Procedure Details:  The patient was seen in the Holding Room. The risks, benefits, complications, treatment options, and expected outcomes were discussed with the patient. The patient concurred with the proposed plan, giving informed consent. identified as Catherine Montgomery and the procedure verified as C-Section Delivery. A Time Out was held and the above information confirmed.   After induction of anesthesia, the patient was prepped and draped in the usual sterile manner. A Pfannenstiel incision was made and carried down through the subcutaneous tissue to the fascia. Fascial incision was made and extended transversely. The fascia was separated from the underlying rectus tissue superiorly and inferiorly. The peritoneum was identified and entered. Peritoneal incision was extended longitudinally. The bladder flap was sharply freed from the lower uterine segment. A low transverse uterine incision was made and the hysterotomy was extended with cranial-caudal tension. Delivered from cephalic presentation was a 3,650 gram Living newborn infant(s) or Female with Apgar scores of 8 at one  minute and 9 at five minutes. Cord ph was not sent the umbilical cord was clamped and cut cord blood was not obtained for evaluation. The placenta was removed Intact and appeared normal. The uterine outline, tubes and ovaries appeared normal. The uterine incision was closed with running locked sutures of 0 Vicryl.  A second layer of the same suture was thrown in an imbricating fashion.  Hemostasis was assured.  The uterus was returned to the abdomen and the paracolic gutters were cleared of all clots and debris.  The rectus muscles were inspected and found to be hemostatic.  The On-Q catheter pumps were inserted in accordance with the manufacturer's recommendations.  The catheters were inserted approximately 4cm cephelad to the incision line, approximately 1cm apart, straddling the midline.  They were inserted to a depth of the 4th mark. They were positioned superficial to the rectus abdominus muscles and deep to the rectus fascia.    The fascia was then reapproximated with running sutures of 1-0 PDS, looped. The subcutaneous tissue was reapproximated using 2-0 plain gut such that no greater than 2cm of dead space remained. The subcuticular closure was performed using 4-0 monocryl. The skin closure was reinforced using surgical skin glue.  The On-Q catheters were bolused with 5 mL of 0.5% marcaine plain for a total of 10 mL.  The catheters were affixed to the skin with surgical skin glue, steri-strips, and tegaderm.    Instrument, sponge, and needle counts were correct prior the abdominal closure and were correct at the conclusion of the case.  The patient received Ancef 2 gram IV prior to skin incision (within 30 minutes). For VTE prophylaxis she was wearing SCDs throughout the case.   Signed: Conard Novak,  MD 04/18/2015 9:30 AM

## 2015-04-18 NOTE — Anesthesia Procedure Notes (Signed)
Spinal  Start time: 04/18/2015 8:15 AM Staffing Resident/CRNA: Almeta MonasFLETCHER, Korrey Schleicher Performed by: resident/CRNA  Preanesthetic Checklist Completed: patient identified, surgical consent, pre-op evaluation, timeout performed, IV checked, risks and benefits discussed and monitors and equipment checked Spinal Block Patient position: sitting Prep: ChloraPrep Patient monitoring: heart rate, cardiac monitor, continuous pulse ox and blood pressure Approach: midline Location: L3-4 Injection technique: single-shot Needle Needle type: Whitacre  Needle gauge: 25 G Assessment Sensory level: T4

## 2015-04-18 NOTE — H&P (Signed)
OB History & Physical   History of Present Illness:  Chief Complaint: here for cesarean section  HPI:  Catherine Montgomery is a 35 y.o. G2P1 female at 4375w1d gestation age dated by early ultrasound.  Her pregnancy has been complicated by obesity and history of cesarean section.    She reports contractions.   She denies leakage of fluid.   She denies vaginal bleeding.   She denies fetal movement.    Maternal Medical History:   Past Medical History  Diagnosis Date  . Environmental allergies   . Anxiety   . Anemia   . Bell's palsy     Lt side    Past Surgical History  Procedure Laterality Date  . Cesarean section      No Known Allergies  Prior to Admission medications   Medication Sig Start Date End Date Taking? Authorizing Provider  ferrous sulfate 325 (65 FE) MG tablet Take 325 mg by mouth daily with breakfast.   Yes Historical Provider, MD  fluticasone (FLONASE) 50 MCG/ACT nasal spray Place 2 sprays into both nostrils daily.   Yes Historical Provider, MD  Prenatal Vit-Fe Fumarate-FA (PRENATAL MULTIVITAMIN) TABS tablet Take 1 tablet by mouth daily at 12 noon.   Yes Historical Provider, MD    OB History  Gravida Para Term Preterm AB SAB TAB Ectopic Multiple Living  2 1        1     # Outcome Date GA Lbr Len/2nd Weight Sex Delivery Anes PTL Lv  2 Current           1 Para 2015    M    Y      Prenatal care site: [facility]  Social History: She  reports that she has never smoked. She does not have any smokeless tobacco history on file. She reports that she does not drink alcohol or use illicit drugs.  Family History: family history is not on file.   Review of Systems: Negative x 10 systems reviewed except as noted in the HPI.    Physical Exam:  Vital Signs: BP 131/80 mmHg  Pulse 95  Temp(Src) 98.4 F (36.9 C) (Oral)  Resp 18  Ht 5\' 8"  (1.727 m)  Wt 117.935 kg (260 lb)  BMI 39.54 kg/m2 General: no acute distress.  HEENT: normocephalic, atraumatic Heart: regular  rate & rhythm.  No murmurs/rubs/gallops Lungs: clear to auscultation bilaterally Abdomen: soft, gravid, non-tender;   Extremities: non-tender, symmetric, no edema bilaterally.   Neurologic: Alert & oriented x 3.    Baseline FHR: 125 beats/min   Variability: moderate   Accelerations: present   Decelerations: absent Contractions: absent frequency: n/a Overall assessment: category 1  Assessment:  Catherine StainsVallie J Mandarino is a 35 y.o. G2P1 female at 7557w1d here for scheduled repeat cesarean section.   Plan:  1. Admit to Labor & Delivery  2. NPO 3. To OR for repeat cesarean delivery 4. [place] well-being: reassuring overall   Conard NovakJackson, Taleisha Kaczynski D, MD 04/18/2015 7:23 AM

## 2015-04-18 NOTE — Anesthesia Preprocedure Evaluation (Signed)
Anesthesia Evaluation  Patient identified by MRN, date of birth, ID band Patient awake    Reviewed: Allergy & Precautions, NPO status , Patient's Chart, lab work & pertinent test results, reviewed documented beta blocker date and time   Airway Mallampati: III  TM Distance: >3 FB     Dental  (+) Chipped   Pulmonary           Cardiovascular      Neuro/Psych Anxiety  Neuromuscular disease    GI/Hepatic   Endo/Other  Morbid obesity  Renal/GU      Musculoskeletal   Abdominal   Peds  Hematology  (+) anemia ,   Anesthesia Other Findings   Reproductive/Obstetrics                             Anesthesia Physical Anesthesia Plan  ASA: III  Anesthesia Plan: Spinal   Post-op Pain Management:    Induction:   Airway Management Planned:   Additional Equipment:   Intra-op Plan:   Post-operative Plan:   Informed Consent: I have reviewed the patients History and Physical, chart, labs and discussed the procedure including the risks, benefits and alternatives for the proposed anesthesia with the patient or authorized representative who has indicated his/her understanding and acceptance.     Plan Discussed with: CRNA  Anesthesia Plan Comments:         Anesthesia Quick Evaluation

## 2015-04-18 NOTE — Discharge Summary (Signed)
OB Discharge Summary  Patient Name: Catherine Montgomery DOB: 01/19/1980 MRN: [id number]  Date of admission: 04/18/2015 Delivering MD: Thomasene MohairStephen Brianny Soulliere, MD Date of Delivery: 04/18/2015  Date of discharge: 04/21/2015  Admitting diagnosis: term pregnancy,prior c-section Intrauterine pregnancy: 3273w1d     Secondary diagnosis: none     Discharge diagnosis: Term Pregnancy Delivered                                                                                                Post partum procedures:none  Augmentation: n/a  Complications: None  Hospital course:  Sceduled C/S   35 y.o. yo G2P1002 at 2373w1d was admitted to the hospital 04/18/2015 for scheduled cesarean section with the following indication:Elective Repeat.  Membrane Rupture Time/Date: 8:38 AM ,04/18/2015   Patient delivered a Viable infant.04/18/2015  Details of operation can be found in separate operative note.  Pateint had an uncomplicated postpartum course.  She is ambulating, tolerating a regular diet, passing flatus, and urinating well. Patient is discharged home in stable condition on  04/21/2015          Physical exam  Filed Vitals:   04/21/15 0002 04/21/15 0439 04/21/15 0754 04/21/15 1100  BP: 135/76  124/92   Pulse: 77  91   Temp: 97.9 F (36.6 C) 99 F (37.2 C) 98.4 F (36.9 C) 98.6 F (37 C)  TempSrc: Oral Oral Oral Oral  Resp: 19     Height:      Weight:      SpO2:   100%    General: alert Lochia: appropriate Uterine Fundus: firm Incision: Healing well with no significant drainage, No significant erythema DVT Evaluation: No evidence of DVT seen on physical exam.  Labs: Lab Results  Component Value Date   WBC 10.9 04/19/2015   HGB 10.0* 04/19/2015   HCT 29.6* 04/19/2015   MCV 84.9 04/19/2015   PLT 198 04/19/2015   CMP Latest Ref Rng 12/07/2014  Glucose 65 - 99 mg/dL 409(W106(H)  BUN 6 - 20 mg/dL 7  Creatinine 1.190.44 - 1.471.00 mg/dL 8.290.52  Sodium 562135 - 130145 mmol/L 135  Potassium 3.5 - 5.1 mmol/L 3.2(L)   Chloride 101 - 111 mmol/L 106  CO2 22 - 32 mmol/L 22  Calcium 8.9 - 10.3 mg/dL 8.6(V8.7(L)  Total Protein 7.8-4.66.4-8.2 g/dL -  Total Bilirubin 9.6-2.90.2-1.0 mg/dL -  Alkaline Phos - -  AST 15-37 Unit/L -  ALT 12-78 U/L -    Discharge instruction: per After Visit Summary.  Medications:    Medication List    TAKE these medications        ferrous sulfate 325 (65 FE) MG tablet  Take 325 mg by mouth daily with breakfast.     fluticasone 50 MCG/ACT nasal spray  Commonly known as:  FLONASE  Place 2 sprays into both nostrils daily.     ibuprofen 600 MG tablet  Commonly known as:  ADVIL,MOTRIN  Take 1 tablet (600 mg total) by mouth every 6 (six) hours.     norethindrone 0.35 MG tablet  Commonly known as:  MICRONOR,CAMILA,ERRIN  Take 1 tablet (0.35 mg total) by mouth  daily.     oxyCODONE-acetaminophen 5-325 MG tablet  Commonly known as:  ROXICET  Take 2 tablets by mouth every 4 (four) hours as needed for severe pain.     prenatal multivitamin Tabs tablet  Take 1 tablet by mouth daily at 12 noon.        Diet: routine diet  Activity: Advance as tolerated. Pelvic rest for 6 weeks.   Outpatient follow up: No Follow-up on file.  Postpartum contraception: Progesterone only pills Rhogam Given postpartum: no Rubella vaccine given postpartum: no Varicella vaccine given postpartum: no TDaP given antepartum or postpartum: AP Flu vaccine given antepartum or postpartum: AP  Newborn Data: Live born female  Birth Weight: 8 lb 0.8 oz (3650 g) APGAR: 8, 9  Baby Feeding: Breast  Disposition:home with mother  SIGNED: Thomasene Mohair, MD 04/21/2015 1:54 PM

## 2015-04-18 NOTE — Transfer of Care (Signed)
Immediate Anesthesia Transfer of Care Note  Patient: Catherine Montgomery  Procedure(s) Performed: Procedure(s): CESAREAN SECTION (N/A)  Patient Location: PACU and Women's Unit  Anesthesia Type:Spinal  Level of Consciousness: awake, alert  and oriented  Airway & Oxygen Therapy: Patient Spontanous Breathing  Post-op Assessment: Report given to RN and Post -op Vital signs reviewed and stable  Post vital signs: stable  Last Vitals:  Filed Vitals:   04/18/15 0700 04/18/15 0944  BP: 131/80 122/73  Pulse: 95   Temp: 36.9 C 36.1 C  Resp: 18 12    Complications: No apparent anesthesia complications

## 2015-04-19 LAB — CBC
HCT: 29.6 % — ABNORMAL LOW (ref 35.0–47.0)
HEMOGLOBIN: 10 g/dL — AB (ref 12.0–16.0)
MCH: 28.6 pg (ref 26.0–34.0)
MCHC: 33.7 g/dL (ref 32.0–36.0)
MCV: 84.9 fL (ref 80.0–100.0)
Platelets: 198 10*3/uL (ref 150–440)
RBC: 3.48 MIL/uL — AB (ref 3.80–5.20)
RDW: 14.6 % — ABNORMAL HIGH (ref 11.5–14.5)
WBC: 10.9 10*3/uL (ref 3.6–11.0)

## 2015-04-19 NOTE — Anesthesia Postprocedure Evaluation (Signed)
Anesthesia Post Note  Patient: Catherine Montgomery  Procedure(s) Performed: Procedure(s) (LRB): CESAREAN SECTION (N/A)  Patient location during evaluation: Mother Baby Anesthesia Type: Spinal Level of consciousness: awake and alert and oriented Pain management: pain level controlled Vital Signs Assessment: post-procedure vital signs reviewed and stable Respiratory status: spontaneous breathing Cardiovascular status: stable Postop Assessment: no headache Anesthetic complications: no    Last Vitals:  Filed Vitals:   04/18/15 2348 04/19/15 0507  BP: 112/76 118/69  Pulse: 91 93  Temp: 37.1 C 36.6 C  Resp: 17 18    Last Pain:  Filed Vitals:   04/19/15 0507  PainSc: 0-No pain                 Quin Mathenia,  Alessandra BevelsJennifer M

## 2015-04-19 NOTE — Progress Notes (Signed)
Urine catheter discontinued at 0900

## 2015-04-19 NOTE — Anesthesia Post-op Follow-up Note (Cosign Needed)
  Anesthesia Pain Follow-up Note  Patient: Catherine Montgomery  Day #: 1  Date of Follow-up: 04/19/2015 Time: 7:28 AM  Last Vitals:  Filed Vitals:   04/18/15 2348 04/19/15 0507  BP: 112/76 118/69  Pulse: 91 93  Temp: 37.1 C 36.6 C  Resp: 17 18    Level of Consciousness: alert  Pain: none   Side Effects:None  Catheter Site Exam:clean, dry  Plan: D/C from anesthesia care  Rica MastBachich,  Aloma Boch M

## 2015-04-19 NOTE — Progress Notes (Addendum)
Daily Post Partum Note  Catherine Montgomery is a 35 y.o. Q4O9629G4P2022  POD#1 s/p rpt c-section @ 1945w1d.  Pregnancy c/b bmi 38, anxiety  24hr/overnight events:  none  Subjective:  Pt doing well. Foley still in. +flatus  Objective:    Current Vital Signs 24h Vital Sign Ranges  T 98.8 F (37.1 C) Temp  Avg: 97.9 F (36.6 C)  Min: 97 F (36.1 C)  Max: 98.8 F (37.1 C)  BP 126/71 mmHg BP  Min: 95/74  Max: 136/76  HR 98 Pulse  Avg: 83.1  Min: 67  Max: 98  RR 18 Resp  Avg: 17.4  Min: 12  Max: 20  SaO2 100 % Not Delivered SpO2  Avg: 99.7 %  Min: 99 %  Max: 100 %       24 Hour I/O Current Shift I/O  Time Ins Outs 04/11 0701 - 04/12 0700 In: 3221.6 [P.O.:240; I.V.:2881.6] Out: 5850 [Urine:5100]      General: NAD Abdomen: c/d/i low transverse dressing in place. On q present. +BS rare, nttp, nd, obese. FF below the umbilicus.  Perineum: deferred Skin:  Warm and dry.  Cardiovascular: Regular rate and rhythm. Respiratory:  Clear to auscultation bilateral. Normal respiratory effort Extremities: scds in place  Medications Current Facility-Administered Medications  Medication Dose Route Frequency Provider Last Rate Last Dose  . bupivacaine ON-Q pain pump   Other Continuous Conard NovakStephen D Jackson, MD      . witch hazel-glycerin (TUCKS) pad 1 application  1 application Topical PRN Conard NovakStephen D Jackson, MD       And  . dibucaine (NUPERCAINAL) 1 % rectal ointment 1 application  1 application Rectal PRN Conard NovakStephen D Jackson, MD      . diphenhydrAMINE (BENADRYL) capsule 25 mg  25 mg Oral Q6H PRN Conard NovakStephen D Jackson, MD      . ferrous sulfate tablet 325 mg  325 mg Oral BID WC Conard NovakStephen D Jackson, MD   325 mg at 04/19/15 0847  . ibuprofen (ADVIL,MOTRIN) tablet 600 mg  600 mg Oral 4 times per day Conard NovakStephen D Jackson, MD   600 mg at 04/18/15 1027  . ketorolac (TORADOL) 30 MG/ML injection 30 mg  30 mg Intravenous Q6H PRN Berdine AddisonMathai Thomas, MD   30 mg at 04/18/15 2351   Or  . ketorolac (TORADOL) 30 MG/ML injection 30 mg   30 mg Intramuscular Q6H PRN Berdine AddisonMathai Thomas, MD      . lactated ringers infusion   Intravenous Continuous Conard NovakStephen D Jackson, MD 125 mL/hr at 04/19/15 0228    . lanolin ointment 1 application  1 application Topical PRN Conard NovakStephen D Jackson, MD      . menthol-cetylpyridinium (CEPACOL) lozenge 3 mg  1 lozenge Oral Q2H PRN Conard NovakStephen D Jackson, MD      . nalbuphine (NUBAIN) injection 5 mg  5 mg Intravenous Once PRN Berdine AddisonMathai Thomas, MD       Or  . nalbuphine (NUBAIN) injection 5 mg  5 mg Subcutaneous Once PRN Berdine AddisonMathai Thomas, MD      . naloxone [facility](NARCAN) injection 0.4 mg  0.4 mg Intravenous PRN Berdine AddisonMathai Thomas, MD       And  . sodium chloride flush (NS) 0.9 % injection 3 mL  3 mL Intravenous PRN Berdine AddisonMathai Thomas, MD      . ondansetron (ZOFRAN) injection 4 mg  4 mg Intravenous Q8H PRN Berdine AddisonMathai Thomas, MD      . oxyCODONE (Oxy IR/ROXICODONE) immediate release tablet 10 mg  10 mg Oral Q4H  PRN Conard Novak, MD      . oxyCODONE (Oxy IR/ROXICODONE) immediate release tablet 5 mg  5 mg Oral Q4H PRN Conard Novak, MD      . prenatal multivitamin tablet 1 tablet  1 tablet Oral Q1200 Conard Novak, MD   1 tablet at 04/18/15 1229  . scopolamine (TRANSDERM-SCOP) 1 MG/3DAYS 1.5 mg  1 patch Transdermal Once Berdine Addison, MD   1.5 mg at 04/18/15 1230  . senna-docusate (Senokot-S) tablet 2 tablet  2 tablet Oral Q24H Conard Novak, MD   2 tablet at 04/19/15 0000  . simethicone (MYLICON) chewable tablet 80 mg  80 mg Oral TID PC Conard Novak, MD   80 mg at 04/19/15 0847    Labs:   Recent Labs Lab 04/17/15 1211 04/19/15 0429  WBC 5.3 10.9  HGB 11.0* 10.0*  HCT 32.9* 29.6*  PLT 213 198   No results for input(s): NA, K, CL, CO2, BUN, CREATININE, LABGLOM, GLUCOSE, CALCIUM in the last 168 hours.  Assessment & Plan:  Pt doing well *Postpartum/postop: routine care. Follow up foley removal and void *Dispo: likely pod#2-3  B POS / Rubella Immune / Varicella Immune/  RPR negative / HIV negative / HepBsAg  negative / Tdap UTD: yes/ pap neg and hpv neg 2015 / Breast  / Contraception: not d/w pt / Follow up: Johnsie Kindred. MD [facility] Pager [phone]

## 2015-04-20 NOTE — Progress Notes (Signed)
Post Op Day 2 Subjective:  Doing well, tolerating PO intake, ambulating and voiding without difficulty, Pain managed with PO meds and OnQ Pump. Breastfeeding is going well. Unsure of birth control method- considering minipill but thinks that may have contributed to decreased milk supply with previous baby.   Objective:  Blood pressure 133/86, pulse 96, temperature 98.5 F (36.9 C), temperature source Oral, resp. rate 18, height 5\' 8"  (1.727 m), weight 117.935 kg (260 lb), SpO2 100 %, breastfeeding.  General: NAD Pulmonary: no increased work of breathing Abdomen: non-distended, non-tender, fundus firm at level of umbilicus Incision: C/D/I no s/s of infection Extremities: no edema, no erythema, no tenderness    Assessment:   35 y.o. G2P1002 postoperativeday # 2   Plan:  1) Acute blood loss anemia - hemodynamically stable and asymptomatic - po ferrous sulfate  2) B+, RI/VI, TDAP UTD  3) Breastfeeding  4) Continue routine postpartum c/section care   5) Contraception: considering options compatible with breastfeeding  6) Disposition: Home tomorrow  Catherine Montgomery,Nabor Thomann, CNM   This patient and plan were discussed with Dr Elesa MassedWard 04/20/2015

## 2015-04-21 MED ORDER — NORETHINDRONE 0.35 MG PO TABS: 1.0000 | ORAL_TABLET | Freq: Every day | ORAL | Status: DC

## 2015-04-21 MED ORDER — IBUPROFEN 600 MG PO TABS: 600.0000 mg | ORAL_TABLET | Freq: Four times a day (QID) | ORAL | Status: DC

## 2015-04-21 MED ORDER — OXYCODONE-ACETAMINOPHEN 5-325 MG PO TABS: 2.0000 | ORAL_TABLET | ORAL | Status: DC | PRN

## 2015-04-21 NOTE — Progress Notes (Signed)
Patient discharged home at 1835.

## 2015-04-21 NOTE — Progress Notes (Signed)
Discharge instructions complete and prescriptions given. Patient verbalizes understanding of teaching. 

## 2015-06-21 ENCOUNTER — Encounter: Payer: Self-pay | Admitting: Emergency Medicine

## 2015-06-21 ENCOUNTER — Emergency Department: Payer: 59

## 2015-06-21 DIAGNOSIS — Z79899 Other long term (current) drug therapy: Secondary | ICD-10-CM | POA: Diagnosis not present

## 2015-06-21 DIAGNOSIS — M79605 Pain in left leg: Secondary | ICD-10-CM | POA: Diagnosis not present

## 2015-06-21 DIAGNOSIS — Z8669 Personal history of other diseases of the nervous system and sense organs: Secondary | ICD-10-CM | POA: Diagnosis not present

## 2015-06-21 NOTE — ED Notes (Addendum)
Patient ambulatory to triage with steady gait, without difficulty or distress noted; pt reports left calf pain, radiating behind knee x 3-4 week, resolved but reoccured tonight; denies any SHOB or CP; pt 2mos postpartum, IUD in place

## 2015-06-22 ENCOUNTER — Emergency Department
Admission: EM | Admit: 2015-06-22 | Discharge: 2015-06-22 | Disposition: A | Discharge status: Home / Self Care | Payer: 59 | Attending: Emergency Medicine | Admitting: Emergency Medicine

## 2015-06-22 DIAGNOSIS — M79605 Pain in left leg: Secondary | ICD-10-CM

## 2015-06-22 NOTE — ED Notes (Signed)
Pt reports she called the insurance nurse line because she was having a throbbing pain in her left leg - The pain radiates mid calf behind knee and up the leg - Pt stated she just had a baby April 11th and the nurse line stated she needed to come to the ER to r/o DVT - Pt denies redness/swelling/warmth to the area

## 2015-06-22 NOTE — ED Provider Notes (Signed)
[facility] Emergency Department Provider Note   ____________________________________________  Time seen: Approximately 1245 AM  I have reviewed the triage vital signs and the nursing notes.   HISTORY  Chief Complaint Leg Pain   HPI Catherine Montgomery is a 35 y.o. female who had a baby about 2 months ago was playing with left lower extremity pain. Says the pain has been intermittent over the past 2 weeks and worsened this evening. She says that she has not been undergoing her normal level of activity and it feels like a spasm type pain to the back of the left knee into the left thigh. York Spaniel that she called her insurance line for medical advice tonight and they told her to the emergency department to get a DVT ultrasound to rule out an DVT. The patient denies any swelling or redness. Says that the pain worsens with movement of the left lower extremity.   Past Medical History  Diagnosis Date  . Environmental allergies   . Anxiety   . Anemia   . Bell's palsy     Lt side    Patient Active Problem List   Diagnosis Date Noted  . Status post cesarean section 04/18/2015    Past Surgical History  Procedure Laterality Date  . Cesarean section    . Cesarean section N/A 04/18/2015    Procedure: CESAREAN SECTION;  Surgeon: Conard Novak, MD;  Location: [facility] ORS;  Service: Obstetrics;  Laterality: N/A;    Current Outpatient Rx  Name  Route  Sig  Dispense  Refill  . ferrous sulfate 325 (65 FE) MG tablet   Oral   Take 325 mg by mouth daily with breakfast.         . fluticasone (FLONASE) 50 MCG/ACT nasal spray   Each Nare   Place 2 sprays into both nostrils daily.         Marland Kitchen ibuprofen (ADVIL,MOTRIN) 600 MG tablet   Oral   Take 1 tablet (600 mg total) by mouth every 6 (six) hours.   30 tablet   0   . norethindrone (MICRONOR,CAMILA,ERRIN) 0.35 MG tablet   Oral   Take 1 tablet (0.35 mg total) by mouth daily.   1 Package   11   .  oxyCODONE-acetaminophen (ROXICET) 5-325 MG tablet   Oral   Take 2 tablets by mouth every 4 (four) hours as needed for severe pain.   30 tablet   0   . Prenatal Vit-Fe Fumarate-FA (PRENATAL MULTIVITAMIN) TABS tablet   Oral   Take 1 tablet by mouth daily at 12 noon.           Allergies Review of patient's allergies indicates no known allergies.  No family history on file.  Social History Social History  Substance Use Topics  . Smoking status: Never Smoker   . Smokeless tobacco: None  . Alcohol Use: No    Review of Systems Constitutional: No fever/chills Eyes: No visual changes. ENT: No sore throat. Cardiovascular: Denies chest pain. Respiratory: Denies shortness of breath. Gastrointestinal: No abdominal pain.  No nausea, no vomiting.  No diarrhea.  No constipation. Genitourinary: Negative for dysuria. Musculoskeletal: Negative for back pain. Skin: Negative for rash. Neurological: Negative for headaches, focal weakness or numbness.  10-point ROS otherwise negative.  ____________________________________________   PHYSICAL EXAM:  VITAL SIGNS: ED Triage Vitals  Enc Vitals Group     BP 06/21/15 2240 122/92 mmHg     Pulse Rate 06/21/15 2240 72  Resp 06/21/15 2240 18     Temp 06/21/15 2240 97.8 F (36.6 C)     Temp Source 06/21/15 2240 Oral     SpO2 06/21/15 2240 98 %     Weight 06/21/15 2240 240 lb 3.2 oz (108.954 kg)     Height 06/21/15 2240  (1.727 m)     Head Cir --      Peak Flow --      Pain Score 06/21/15 2240 10     Pain Loc --      Pain Edu? --      Excl. in GC? --     Constitutional: Alert and oriented. Well appearing and in no acute distress. Eyes: Conjunctivae are normal. PERRL. EOMI. Head: Atraumatic. Nose: No congestion/rhinnorhea. Mouth/Throat: Mucous membranes are moist.   Neck: No stridor.   Cardiovascular: Normal rate, regular rhythm. Grossly normal heart sounds.  Good peripheral circulation with bilateral and equal dorsalis  pedis pulses. Respiratory: Normal respiratory effort.  No retractions. Lungs CTAB. Gastrointestinal: Soft and nontender. No distention. Musculoskeletal: No lower extremity tenderness nor edema.  No joint effusions. No tenderness or swelling to the left popliteal fossa or thigh. Skin:  Skin is warm, dry and intact. No rash noted.o the left popliteal fossa or thigh. Neurologic:  Normal speech and language. No gross focal neurologic deficits are appreciated.  Psychiatric: Mood and affect are normal. Speech and behavior are normal.  ____________________________________________   LABS (all labs ordered are listed, but only abnormal results are displayed)  Labs Reviewed - No data to display ____________________________________________  EKG   ____________________________________________  RADIOLOGY  US Venous Img Lower Unilateral Left (Final result) Result time: 06/21/15 23:35:15   Final result by Rad Results In Interface (06/21/15 23:35:15)   Narrative:   CLINICAL DATA: Left leg pain for 3 weeks.  EXAM: Left LOWER EXTREMITY VENOUS DOPPLER ULTRASOUND  TECHNIQUE: Gray-scale sonography with graded compression, as well as color Doppler and duplex ultrasound were performed to evaluate the lower extremity deep venous systems from the level of the common femoral vein and including the common femoral, femoral, profunda femoral, popliteal and calf veins including the posterior tibial, peroneal and gastrocnemius veins when visible. The superficial great saphenous vein was also interrogated. Spectral Doppler was utilized to evaluate flow at rest and with distal augmentation maneuvers in the common femoral, femoral and popliteal veins.  COMPARISON: None.  FINDINGS: Contralateral Common Femoral Vein: Respiratory phasicity is normal and symmetric with the symptomatic side. No evidence of thrombus. Normal compressibility.  Common Femoral Vein: No evidence of thrombus.  Normal compressibility, respiratory phasicity and response to augmentation.  Saphenofemoral Junction: No evidence of thrombus. Normal compressibility and flow on color Doppler imaging.  Profunda Femoral Vein: No evidence of thrombus. Normal compressibility and flow on color Doppler imaging.  Femoral Vein: No evidence of thrombus. Normal compressibility, respiratory phasicity and response to augmentation.  Popliteal Vein: No evidence of thrombus. Normal compressibility, respiratory phasicity and response to augmentation.  Calf Veins: No evidence of thrombus. Normal compressibility and flow on color Doppler imaging.  Superficial Great Saphenous Vein: No evidence of thrombus. Normal compressibility and flow on color Doppler imaging.  Venous Reflux: None.  Other Findings: None.  IMPRESSION: No evidence of deep venous thrombosis.   Electronically Signed By: Burman Nieves M.D. On: 06/21/2015 23:35       ____________________________________________   PROCEDURES   ____________________________________________   INITIAL IMPRESSION / ASSESSMENT AND PLAN / ED COURSE  Pertinent labs & imaging results that were  available during my care of the patient were reviewed by me and considered in my medical decision making (see chart for details).  Patient with a negative DVT ultrasound of the left lower extremity. Likely muscle related. We discussed using a muscle cream such as Aspercreme or icy hot. The patient in house reviewed stretches for the Hamstring. She'll be following up with [facility]. Will be discharged home. ____________________________________________   FINAL CLINICAL IMPRESSION(S) / ED DIAGNOSES  Left leg pain.    NEW MEDICATIONS STARTED DURING THIS VISIT:  New Prescriptions   No medications on file     Note:  This document was prepared using Dragon voice recognition software and may include unintentional dictation  errors.    Myrna Blazeravid Matthew Blaklee Shores, MD 06/22/15 [phone]

## 2015-08-12 ENCOUNTER — Ambulatory Visit (HOSPITAL_COMMUNITY)
Admission: EM | Admit: 2015-08-12 | Discharge: 2015-08-12 | Disposition: A | Discharge status: Home / Self Care | Payer: 59 | Attending: Physician Assistant | Admitting: Physician Assistant

## 2015-08-12 ENCOUNTER — Encounter (HOSPITAL_COMMUNITY): Payer: Self-pay | Admitting: Emergency Medicine

## 2015-08-12 DIAGNOSIS — N390 Urinary tract infection, site not specified: Secondary | ICD-10-CM | POA: Diagnosis not present

## 2015-08-12 LAB — POCT URINALYSIS DIP (DEVICE)
Bilirubin Urine: NEGATIVE
GLUCOSE, UA: NEGATIVE mg/dL
KETONES UR: NEGATIVE mg/dL
Nitrite: NEGATIVE
Protein, ur: 30 mg/dL — AB
SPECIFIC GRAVITY, URINE: 1.025 (ref 1.005–1.030)
UROBILINOGEN UA: 0.2 mg/dL (ref 0.0–1.0)
pH: 7 (ref 5.0–8.0)

## 2015-08-12 MED ORDER — CEPHALEXIN 250 MG PO CAPS: 250.0000 mg | ORAL_CAPSULE | Freq: Three times a day (TID) | ORAL | 0 refills | Status: DC

## 2015-08-12 NOTE — ED Triage Notes (Signed)
The patient presented to the [facility] with a complaint of dysuria and urinary frequency with some abdominal pain x 2 days.

## 2015-08-12 NOTE — Discharge Instructions (Signed)
DO NOT USE AZO OR PYRIDIUM WHILE BREAST FEEDING

## 2016-01-10 DIAGNOSIS — J302 Other seasonal allergic rhinitis: Secondary | ICD-10-CM | POA: Diagnosis not present

## 2016-01-10 DIAGNOSIS — E669 Obesity, unspecified: Secondary | ICD-10-CM | POA: Insufficient documentation

## 2016-01-10 DIAGNOSIS — J301 Allergic rhinitis due to pollen: Secondary | ICD-10-CM | POA: Insufficient documentation

## 2016-01-29 DIAGNOSIS — Z9189 Other specified personal risk factors, not elsewhere classified: Secondary | ICD-10-CM | POA: Insufficient documentation

## 2016-01-29 NOTE — Progress Notes (Deleted)
[facility]  Telephone:(336) [phone] Fax:(336) [phone]  ID: Catherine StainsVallie J Montgomery OB: 05-Dec-1980  MR#: [id number]  QIO#:[id number]#:[id number]  Patient Care Team: Nadara Mustardobert P Harris, MD as PCP - General (Obstetrics and Gynecology)  CHIEF COMPLAINT: High risk for breast cancer.  INTERVAL HISTORY: ***  REVIEW OF SYSTEMS:   ROS  As per HPI. Otherwise, a complete review of systems is negative.  PAST MEDICAL HISTORY: Past Medical History:  Diagnosis Date  . Anemia   . Anxiety   . Bell's palsy    Lt side  . Environmental allergies     PAST SURGICAL HISTORY: Past Surgical History:  Procedure Laterality Date  . CESAREAN SECTION    . CESAREAN SECTION N/A 04/18/2015   Procedure: CESAREAN SECTION;  Surgeon: Conard NovakStephen D Jackson, MD;  Location: [facility] ORS;  Service: Obstetrics;  Laterality: N/A;    FAMILY HISTORY: No family history on file.  ADVANCED DIRECTIVES (Y/N):  N  HEALTH MAINTENANCE: Social History  Substance Use Topics  . Smoking status: Never Smoker  . Smokeless tobacco: Not on file  . Alcohol use No     Colonoscopy:  PAP:  Bone density:  Lipid panel:  No Known Allergies  Current Outpatient Prescriptions  Medication Sig Dispense Refill  . cephALEXin (KEFLEX) 250 MG capsule Take 1 capsule (250 mg total) by mouth 3 (three) times daily. 15 capsule 0  . ferrous sulfate 325 (65 FE) MG tablet Take 325 mg by mouth daily with breakfast.    . fluticasone (FLONASE) 50 MCG/ACT nasal spray Place 2 sprays into both nostrils daily.    Marland Kitchen. ibuprofen (ADVIL,MOTRIN) 600 MG tablet Take 1 tablet (600 mg total) by mouth every 6 (six) hours. 30 tablet 0  . norethindrone (MICRONOR,CAMILA,ERRIN) 0.35 MG tablet Take 1 tablet (0.35 mg total) by mouth daily. 1 Package 11  . oxyCODONE-acetaminophen (ROXICET) 5-325 MG tablet Take 2 tablets by mouth every 4 (four) hours as needed for severe pain. 30 tablet 0  . Prenatal Vit-Fe Fumarate-FA (PRENATAL MULTIVITAMIN) TABS tablet Take 1 tablet by  mouth daily at 12 noon.     No current facility-administered medications for this visit.     OBJECTIVE: There were no vitals filed for this visit.   There is no height or weight on file to calculate BMI.    ECOG FS:{CHL ONC [phone]}  General: Well-developed, well-nourished, no acute distress. Eyes: Pink conjunctiva, anicteric sclera. HEENT: Normocephalic, moist mucous membranes, clear oropharnyx. Lungs: Clear to auscultation bilaterally. Heart: Regular rate and rhythm. No rubs, murmurs, or gallops. Abdomen: Soft, nontender, nondistended. No organomegaly noted, normoactive bowel sounds. Musculoskeletal: No edema, cyanosis, or clubbing. Neuro: Alert, answering all questions appropriately. Cranial nerves grossly intact. Skin: No rashes or petechiae noted. Psych: Normal affect. Lymphatics: No cervical, calvicular, axillary or inguinal LAD.   LAB RESULTS:  Lab Results  Component Value Date   NA 135 12/07/2014   K 3.2 (L) 12/07/2014   CL 106 12/07/2014   CO2 22 12/07/2014   GLUCOSE 106 (H) 12/07/2014   BUN 7 12/07/2014   CREATININE 0.52 12/07/2014   CALCIUM 8.7 (L) 12/07/2014   PROT 7.5 07/10/2013   ALBUMIN 2.9 (L) 07/10/2013   AST 24 07/10/2013   ALT 28 07/10/2013   ALKPHOS 105 07/10/2013   BILITOT 0.3 07/10/2013   GFRNONAA >60 12/07/2014   GFRAA >60 12/07/2014    Lab Results  Component Value Date   WBC 10.9 04/19/2015   NEUTROABS 5.6 06/30/2013   HGB 10.0 (L) 04/19/2015   HCT  29.6 (L) 04/19/2015   MCV 84.9 04/19/2015   PLT 198 04/19/2015     STUDIES: No results found.  ASSESSMENT: High risk for breast cancer.  PLAN:    1. High risk for breast cancer:  Patient expressed understanding and was in agreement with this plan. She also understands that She can call clinic at any time with any questions, concerns, or complaints.   Cancer Staging No matching staging information was found for the patient.  Jeralyn Ruths, MD   01/29/2016 11:15  PM

## 2016-01-30 ENCOUNTER — Inpatient Hospital Stay: Payer: 59 | Admitting: Oncology

## 2016-02-18 NOTE — Progress Notes (Deleted)
[facility]  Telephone:(336) [phone] Fax:(336) [phone]  ID: Catherine Montgomery OB: May 16, 1980  MR#: [id number]  WNU#:[id number]#:[id number]  Patient Care Team: Nadara Mustardobert P Harris, MD as PCP - General (Obstetrics and Gynecology)  CHIEF COMPLAINT: High risk for breast cancer.  INTERVAL HISTORY: ***  REVIEW OF SYSTEMS:   ROS  As per HPI. Otherwise, a complete review of systems is negative.  PAST MEDICAL HISTORY: Past Medical History:  Diagnosis Date  . Anemia   . Anxiety   . Bell's palsy    Lt side  . Environmental allergies     PAST SURGICAL HISTORY: Past Surgical History:  Procedure Laterality Date  . CESAREAN SECTION    . CESAREAN SECTION N/A 04/18/2015   Procedure: CESAREAN SECTION;  Surgeon: Conard NovakStephen D Jackson, MD;  Location: [facility] ORS;  Service: Obstetrics;  Laterality: N/A;    FAMILY HISTORY: No family history on file.  ADVANCED DIRECTIVES (Y/N):  N  HEALTH MAINTENANCE: Social History  Substance Use Topics  . Smoking status: Never Smoker  . Smokeless tobacco: Not on file  . Alcohol use No     Colonoscopy:  PAP:  Bone density:  Lipid panel:  No Known Allergies  Current Outpatient Prescriptions  Medication Sig Dispense Refill  . cephALEXin (KEFLEX) 250 MG capsule Take 1 capsule (250 mg total) by mouth 3 (three) times daily. 15 capsule 0  . ferrous sulfate 325 (65 FE) MG tablet Take 325 mg by mouth daily with breakfast.    . fluticasone (FLONASE) 50 MCG/ACT nasal spray Place 2 sprays into both nostrils daily.    Marland Kitchen. ibuprofen (ADVIL,MOTRIN) 600 MG tablet Take 1 tablet (600 mg total) by mouth every 6 (six) hours. 30 tablet 0  . norethindrone (MICRONOR,CAMILA,ERRIN) 0.35 MG tablet Take 1 tablet (0.35 mg total) by mouth daily. 1 Package 11  . oxyCODONE-acetaminophen (ROXICET) 5-325 MG tablet Take 2 tablets by mouth every 4 (four) hours as needed for severe pain. 30 tablet 0  . Prenatal Vit-Fe Fumarate-FA (PRENATAL MULTIVITAMIN) TABS tablet Take 1 tablet by  mouth daily at 12 noon.     No current facility-administered medications for this visit.     OBJECTIVE: There were no vitals filed for this visit.   There is no height or weight on file to calculate BMI.    ECOG FS:{CHL ONC [phone]}  General: Well-developed, well-nourished, no acute distress. Eyes: Pink conjunctiva, anicteric sclera. HEENT: Normocephalic, moist mucous membranes, clear oropharnyx. Lungs: Clear to auscultation bilaterally. Heart: Regular rate and rhythm. No rubs, murmurs, or gallops. Abdomen: Soft, nontender, nondistended. No organomegaly noted, normoactive bowel sounds. Musculoskeletal: No edema, cyanosis, or clubbing. Neuro: Alert, answering all questions appropriately. Cranial nerves grossly intact. Skin: No rashes or petechiae noted. Psych: Normal affect. Lymphatics: No cervical, calvicular, axillary or inguinal LAD.   LAB RESULTS:  Lab Results  Component Value Date   NA 135 12/07/2014   K 3.2 (L) 12/07/2014   CL 106 12/07/2014   CO2 22 12/07/2014   GLUCOSE 106 (H) 12/07/2014   BUN 7 12/07/2014   CREATININE 0.52 12/07/2014   CALCIUM 8.7 (L) 12/07/2014   PROT 7.5 07/10/2013   ALBUMIN 2.9 (L) 07/10/2013   AST 24 07/10/2013   ALT 28 07/10/2013   ALKPHOS 105 07/10/2013   BILITOT 0.3 07/10/2013   GFRNONAA >60 12/07/2014   GFRAA >60 12/07/2014    Lab Results  Component Value Date   WBC 10.9 04/19/2015   NEUTROABS 5.6 06/30/2013   HGB 10.0 (L) 04/19/2015   HCT  29.6 (L) 04/19/2015   MCV 84.9 04/19/2015   PLT 198 04/19/2015     STUDIES: No results found.  ASSESSMENT: High risk for breast cancer.  PLAN:    1. High risk for breast cancer:  Patient expressed understanding and was in agreement with this plan. She also understands that She can call clinic at any time with any questions, concerns, or complaints.   Cancer Staging No matching staging information was found for the patient.  Jeralyn Ruths, MD   02/18/2016 10:33  PM

## 2016-02-19 ENCOUNTER — Inpatient Hospital Stay: Payer: 59 | Admitting: Oncology

## 2016-04-03 DIAGNOSIS — R05 Cough: Secondary | ICD-10-CM | POA: Diagnosis not present

## 2016-04-03 DIAGNOSIS — R509 Fever, unspecified: Secondary | ICD-10-CM | POA: Diagnosis not present

## 2016-05-13 DIAGNOSIS — H6122 Impacted cerumen, left ear: Secondary | ICD-10-CM | POA: Diagnosis not present

## 2016-07-08 DIAGNOSIS — Z0000 Encounter for general adult medical examination without abnormal findings: Secondary | ICD-10-CM | POA: Diagnosis not present

## 2016-07-15 DIAGNOSIS — Z0000 Encounter for general adult medical examination without abnormal findings: Secondary | ICD-10-CM | POA: Diagnosis not present

## 2016-07-15 DIAGNOSIS — R7303 Prediabetes: Secondary | ICD-10-CM | POA: Diagnosis not present

## 2016-07-16 ENCOUNTER — Ambulatory Visit: Payer: Self-pay | Admitting: Obstetrics and Gynecology

## 2016-07-17 ENCOUNTER — Ambulatory Visit (INDEPENDENT_AMBULATORY_CARE_PROVIDER_SITE_OTHER): Payer: 59 | Admitting: Certified Nurse Midwife

## 2016-07-17 ENCOUNTER — Encounter: Payer: Self-pay | Admitting: Certified Nurse Midwife

## 2016-07-17 VITALS — BP 120/80 | HR 94 | Ht 68.0 in | Wt 249.0 lb

## 2016-07-17 DIAGNOSIS — Z975 Presence of (intrauterine) contraceptive device: Secondary | ICD-10-CM | POA: Diagnosis not present

## 2016-07-17 DIAGNOSIS — R109 Unspecified abdominal pain: Secondary | ICD-10-CM

## 2016-07-17 LAB — POCT URINE PREGNANCY: PREG TEST UR: NEGATIVE

## 2016-07-17 NOTE — Progress Notes (Signed)
Obstetrics & Gynecology Office Visit   Chief Complaint:  Chief Complaint  Patient presents with  . Abdominal Cramping    History of Present Illness: 36 year old 614 14P2022 Black female presents with c/o abdominal cramping x 1-2 days a couple of weeks ago. She has concerns regarding her IUD placement. She had a Mirena IUD placed last year at her 6 week postpartum visit. She has never been able to feel her IUD strings. She is still breastfeeding (usually 3x/night and is pumping once during the day), but she is having irregular menses on the mirena. The menses on the Mirena usually lasts 1-2 days and is light. Her last menses was about 3 weeks ago. She also has questions regarding the possibility of her having PCOS. A recent hemoglobin A1C was 6% and she has had problems losing weight in the last year. Her menses prior to having children were regular/ monthly, she never had problems with acne or hirsutism. There is a family history of DM in her father, MGF, MGM and sister. Her weight at the time of her last Cesarean section was 270#. She is walking for exercise at work, usually 1-1.3 miles. Has not really modified her diet to try to lose weight.    Review of Systems:  Review of Systems  Constitutional: Negative for chills, fever and weight loss.  Gastrointestinal: Positive for abdominal pain (lower abdominal cramping x 1-2 days a couple of weeks ago).  Genitourinary: Negative for dysuria.       See HPI     Past Medical History:  Past Medical History:  Diagnosis Date  . Anemia   . Anxiety   . Bell's palsy    Lt side  . Environmental allergies   . Obesity (BMI 35.0-39.9 without comorbidity)   . Vulvar intraepithelial neoplasia (VIN) grade 3     Past Surgical History:  Past Surgical History:  Procedure Laterality Date  . CESAREAN SECTION    . CESAREAN SECTION N/A 04/18/2015   Procedure: CESAREAN SECTION;  Surgeon: Conard NovakStephen D Jackson, MD;  Location: [facility] ORS;  Service: Obstetrics;   Laterality: N/A;  . EYE SURGERY  in college  . VULVA /PERINEUM BIOPSY     VIN 3      Obstetric History: [id number]  Family History:  Family History  Problem Relation Age of Onset  . Hypertension Mother   . Diabetes Father   . Hypertension Father   . Diabetes Sister   . Diabetes Maternal Grandmother   . Diabetes Maternal Grandfather   . Breast cancer Paternal Grandmother        3940's  . Breast cancer Paternal Uncle 2245  . Breast cancer Maternal Aunt 45  . Colon cancer Paternal Uncle     Social History:  Social History   Social History  . Marital status: Married    Spouse name: N/A  . Number of children: 2  . Years of education: N/A   Occupational History  . Not on file.   Social History Main Topics  . Smoking status: Never Smoker  . Smokeless tobacco: Not on file  . Alcohol use No  . Drug use: No  . Sexual activity: Yes    Partners: Male    Birth control/ protection: IUD     Comment: Mirena   Other Topics Concern  . Not on file   Social History Narrative  . No narrative on file    Allergies:  No Known Allergies  Medications: Mirena IUD Multivitamin Flonase  Physical Exam Vitals: There were no vitals filed for this visit. No LMP recorded.  Physical Exam  Constitutional: She is oriented to person, place, and time. She appears well-developed and well-nourished. No distress.  GI: Soft. She exhibits no distension and no mass. There is no tenderness.  Genitourinary:  Genitourinary Comments: Vulva: no lesions Vagina: no lesions, thin white discharge Cervix: posterior, no lesions, IUD strings at 12 o'clock, NT Uterus: AV , rotated to the right, nssc, NT Adnexa: no masses  Musculoskeletal: Normal range of motion.  Neurological: She is alert and oriented to person, place, and time.  Skin: Skin is warm and dry.  Psychiatric: She has a normal mood and affect.     Assessment: 36 y.o. G4 P2022 with abdominal cramping IUD appears to be intact, no masses  palpated, but difficult exam due to body habitus  Plan: Pelvic ultrasound ordered. FU after ultrasound Recommend low CHO diet to help lose weight and increased exercise  Farrel Conners, CNM

## 2016-08-01 ENCOUNTER — Ambulatory Visit (INDEPENDENT_AMBULATORY_CARE_PROVIDER_SITE_OTHER): Payer: 59

## 2016-08-01 ENCOUNTER — Other Ambulatory Visit: Payer: 59

## 2016-08-01 ENCOUNTER — Ambulatory Visit: Payer: 59 | Admitting: Certified Nurse Midwife

## 2016-08-01 ENCOUNTER — Encounter: Payer: Self-pay | Admitting: Certified Nurse Midwife

## 2016-08-01 ENCOUNTER — Ambulatory Visit (INDEPENDENT_AMBULATORY_CARE_PROVIDER_SITE_OTHER): Payer: 59 | Admitting: Certified Nurse Midwife

## 2016-08-01 VITALS — BP 130/78 | HR 68 | Ht 68.0 in | Wt 246.0 lb

## 2016-08-01 DIAGNOSIS — R109 Unspecified abdominal pain: Secondary | ICD-10-CM

## 2016-08-01 DIAGNOSIS — Z975 Presence of (intrauterine) contraceptive device: Secondary | ICD-10-CM

## 2016-08-01 NOTE — Progress Notes (Signed)
  HPI: 36 year old G4 P2022 presents for a follow up after her ultrasound today for abdominal cramping. She has a Mirena IUD that was inserted last year and the ultrasound was ordered for IUD location. As well as to look for ovarian masses.  Ultrasound demonstrates a normal placement of the IUD in the fundus and normal appearing ovaries.  She is also trying to lose weight and she has started to follow the belly fat diet and has lost 3 # in the last 2 weeks.   PMHx: She  has a past medical history of Anemia; Anxiety; Bell's palsy; Environmental allergies; Obesity (BMI 35.0-39.9 without comorbidity); and Vulvar intraepithelial neoplasia (VIN) grade 3. Also,  has a past surgical history that includes Cesarean section; Cesarean section (N/A, 04/18/2015); Eye surgery (in college); and Vulva / perineum biopsy., family history includes Breast cancer in her paternal grandmother; Breast cancer (age of onset: 845) in her maternal aunt and paternal uncle; Colon cancer in her paternal uncle; Diabetes in her father, maternal grandfather, maternal grandmother, and sister; Hypertension in her father and mother.,  reports that she has never smoked. She has never used smokeless tobacco. She reports that she does not drink alcohol or use drugs.  She has a current medication list which includes the following prescription(s): fexofenadine, fluticasone, ketoconazole, levonorgestrel, and multivitamin. Also, has No Known Allergies.  ROS  Objective: BP 130/78   Pulse 68   Ht 5\' 8"  (1.727 m)   Wt 246 lb (111.6 kg)   BMI 37.40 kg/m   Physical examination Constitutional NAD, Conversant  Skin No rashes, lesions or ulceration.   Extremities: Moves all appropriately.  Normal ROM for age. No lymphadenopathy.  Neuro: Grossly intact  Psych: Oriented to PPT.  Normal mood. Normal affect.   Assessment:  Normal pelvic ultrasound Obesity  Plan: Continue with weight loss plans Recommend waiting until her youngest is 35  years old before conceiving. (still breast feeding 36 year old and has a 36 yo/ 2 Cesarean sections).  RTO 1 year for annual and prn   Farrel Connersolleen Jaylanni Eltringham, CNM

## 2016-08-11 IMAGING — US US EXTREM LOW VENOUS*L*
1 series · 13 of 24 positions shown · non-contrast
Comparison: None.

CLINICAL DATA: Left leg pain for 3 weeks.



[Series 1: us extrem low venous*left* · 0.07mm/px · 13 of 43 slices shown]
[im 1/43]
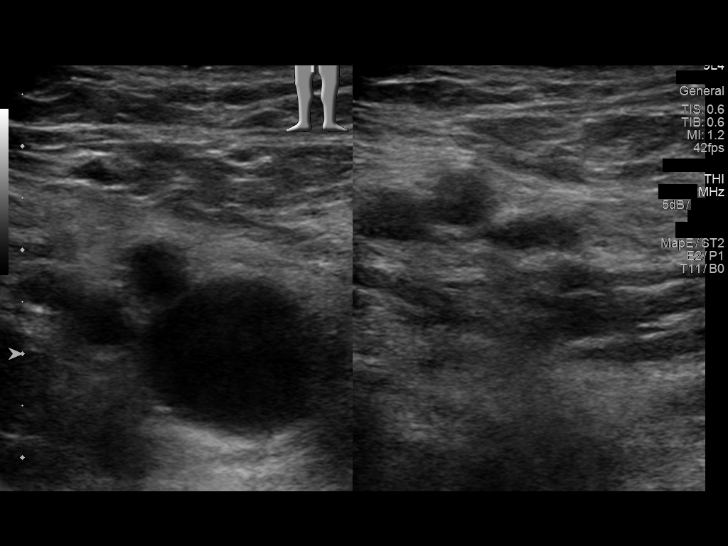
[im 4/43]
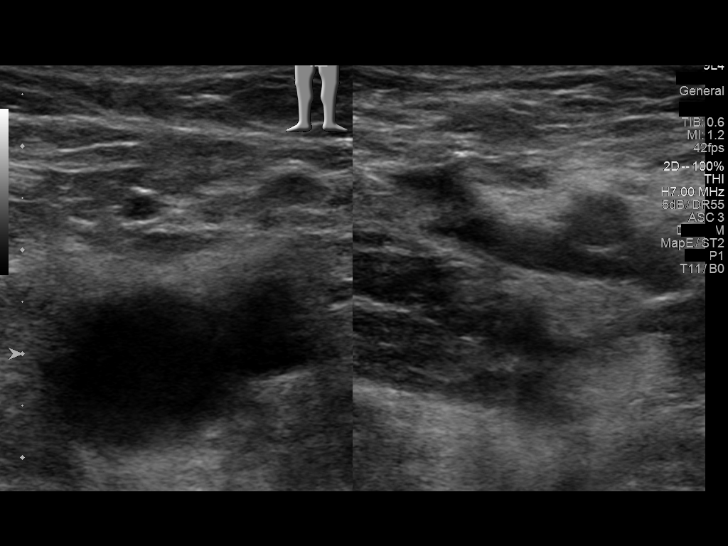
[im 8/43]
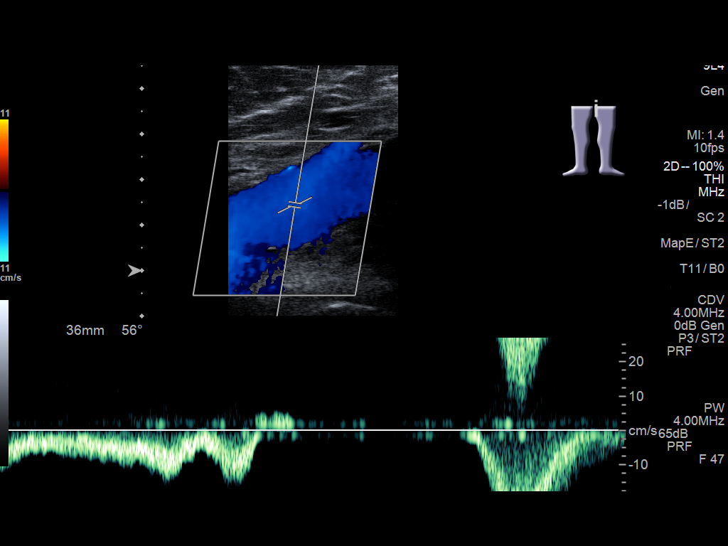
[im 11/43]
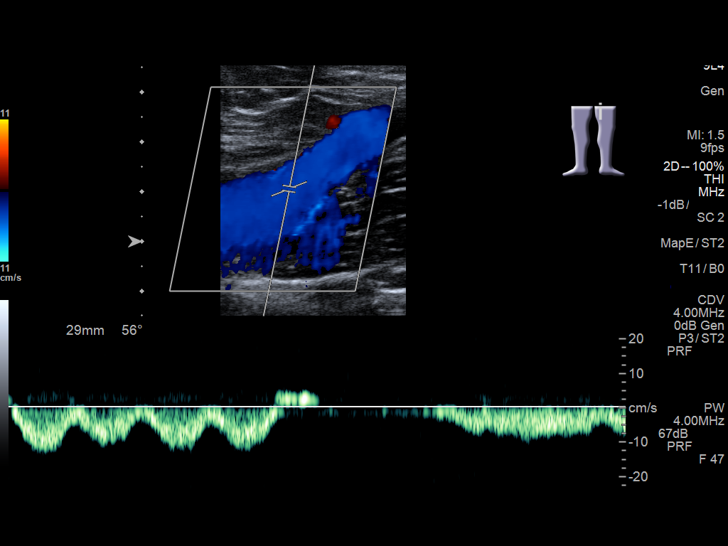
[im 15/43]
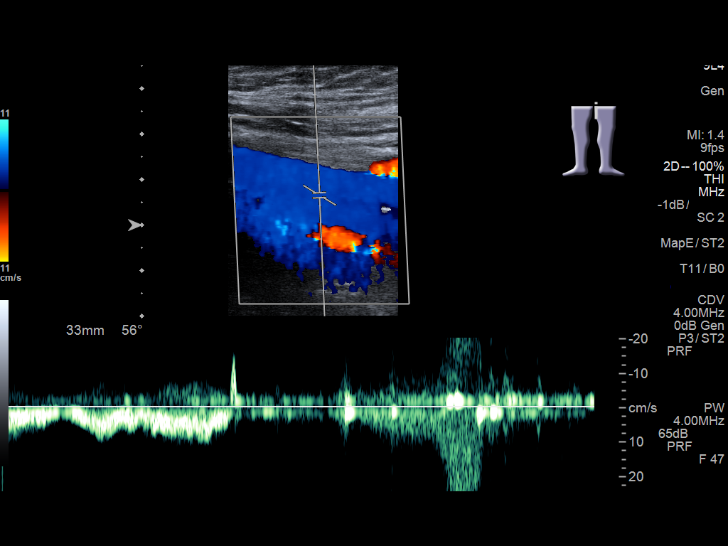
[im 19/43]
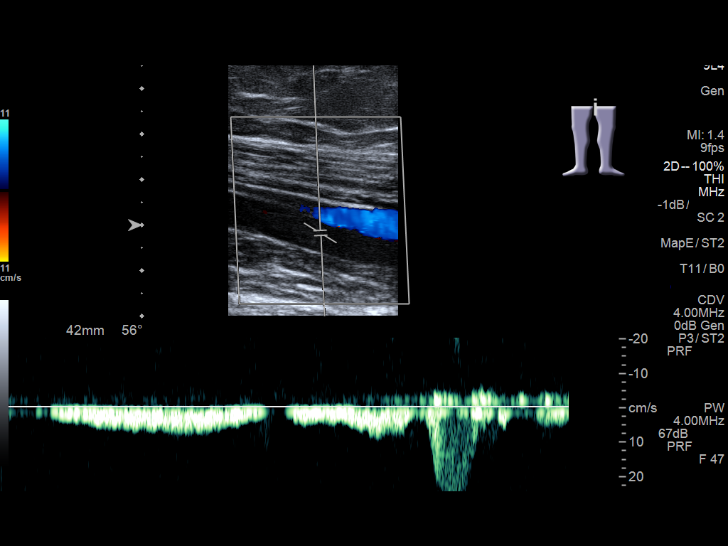
[im 22/43]
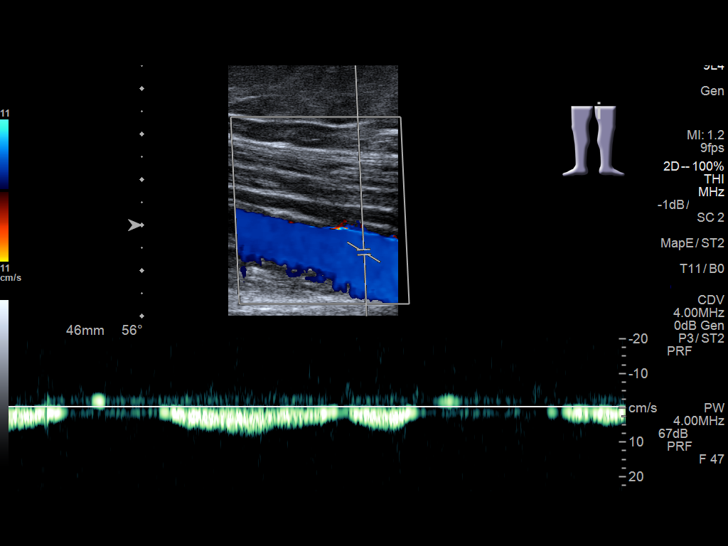
[im 24/43]
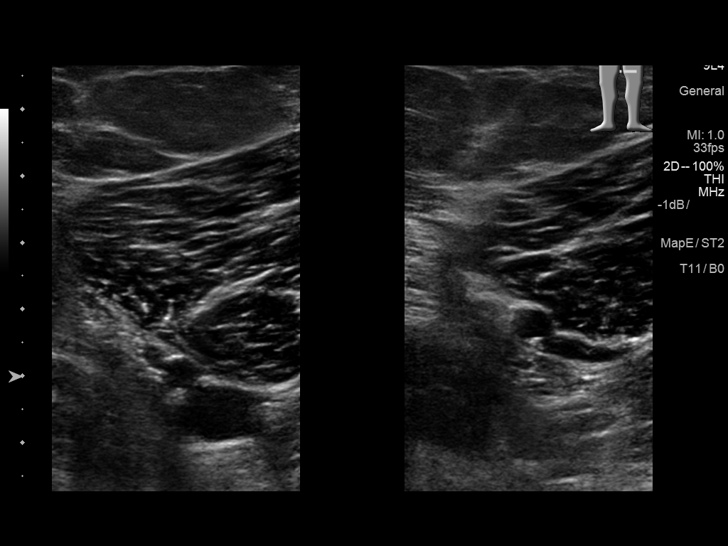
[im 28/43]
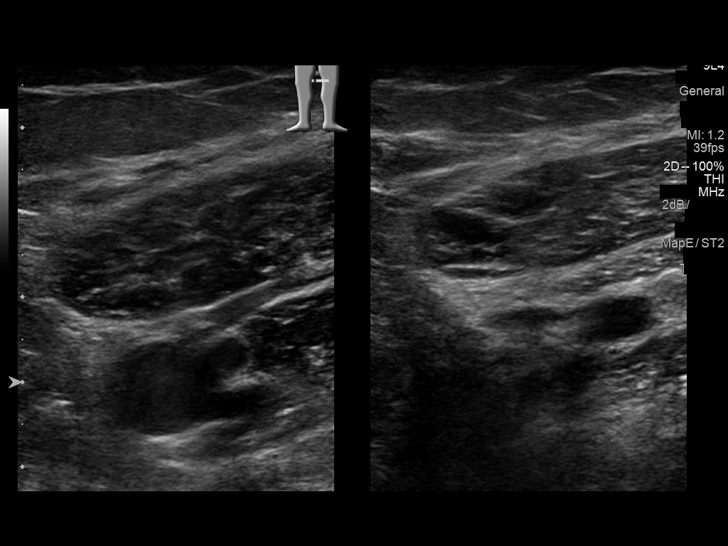
[im 32/43]
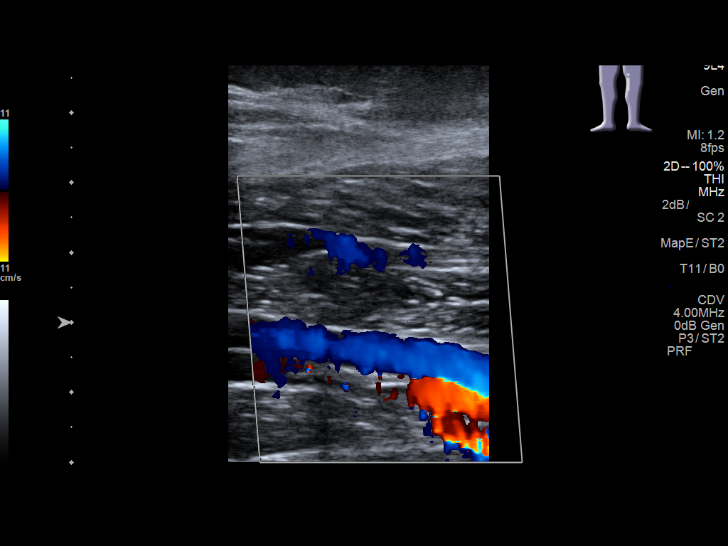
[im 35/43]
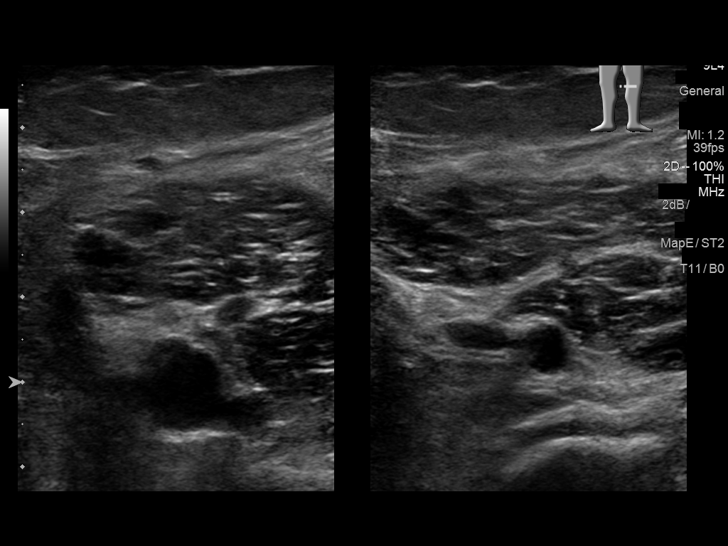
[im 39/43]
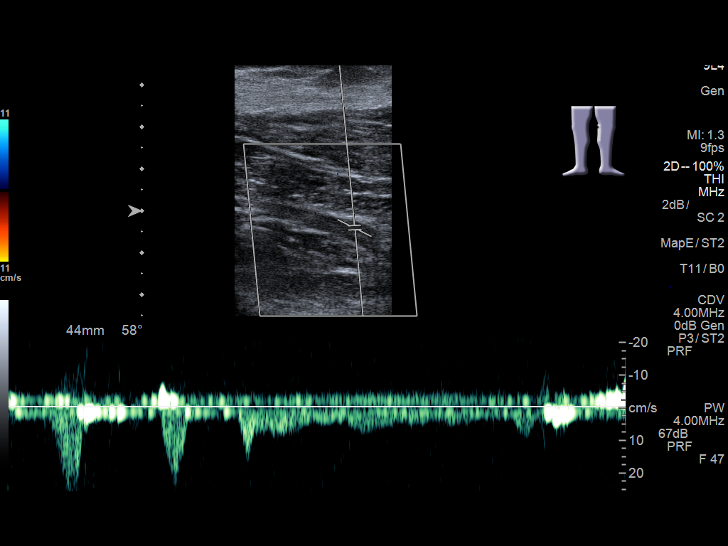
[im 43/43]
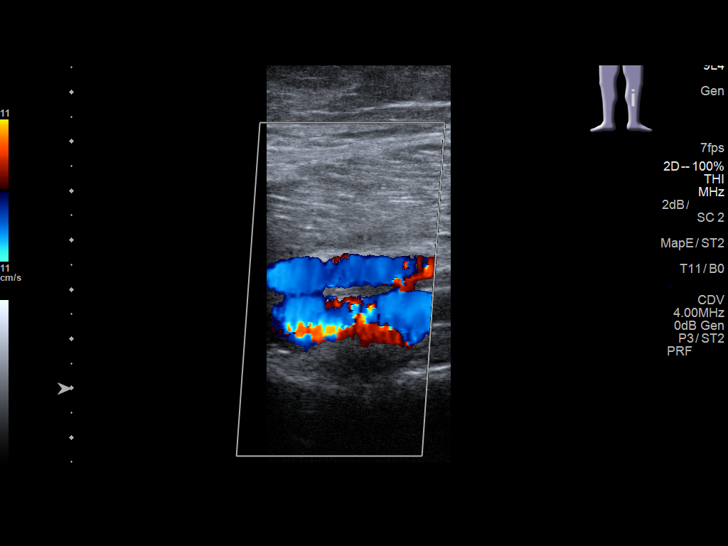

[13 of 24 positions shown; findings below may reference images not displayed]

FINDINGS: Contralateral Common Femoral Vein: Respiratory phasicity is normal
and symmetric with the symptomatic side. No evidence of thrombus.
Normal compressibility.

Common Femoral Vein: No evidence of thrombus. Normal
compressibility, respiratory phasicity and response to augmentation.

Saphenofemoral Junction: No evidence of thrombus. Normal
compressibility and flow on color Doppler imaging.

Profunda Femoral Vein: No evidence of thrombus. Normal
compressibility and flow on color Doppler imaging.

Femoral Vein: No evidence of thrombus. Normal compressibility,
respiratory phasicity and response to augmentation.

Popliteal Vein: No evidence of thrombus. Normal compressibility,
respiratory phasicity and response to augmentation.

Calf Veins: No evidence of thrombus. Normal compressibility and flow
on color Doppler imaging.

Superficial Great Saphenous Vein: No evidence of thrombus. Normal
compressibility and flow on color Doppler imaging.

Venous Reflux:  None.

Other Findings:  None.
IMPRESSION: No evidence of deep venous thrombosis.

## 2016-09-28 DIAGNOSIS — Z23 Encounter for immunization: Secondary | ICD-10-CM | POA: Diagnosis not present

## 2016-11-11 ENCOUNTER — Ambulatory Visit (INDEPENDENT_AMBULATORY_CARE_PROVIDER_SITE_OTHER): Payer: 59 | Admitting: Obstetrics and Gynecology

## 2016-11-11 ENCOUNTER — Other Ambulatory Visit: Payer: Self-pay | Admitting: Obstetrics and Gynecology

## 2016-11-11 ENCOUNTER — Encounter: Payer: Self-pay | Admitting: Obstetrics and Gynecology

## 2016-11-11 VITALS — BP 128/88 | HR 80 | Ht 68.0 in | Wt 254.0 lb

## 2016-11-11 DIAGNOSIS — Z131 Encounter for screening for diabetes mellitus: Secondary | ICD-10-CM | POA: Diagnosis not present

## 2016-11-11 DIAGNOSIS — Z6838 Body mass index (BMI) 38.0-38.9, adult: Secondary | ICD-10-CM

## 2016-11-11 DIAGNOSIS — Z01419 Encounter for gynecological examination (general) (routine) without abnormal findings: Secondary | ICD-10-CM

## 2016-11-11 DIAGNOSIS — Z124 Encounter for screening for malignant neoplasm of cervix: Secondary | ICD-10-CM

## 2016-11-11 DIAGNOSIS — Z1231 Encounter for screening mammogram for malignant neoplasm of breast: Secondary | ICD-10-CM

## 2016-11-11 DIAGNOSIS — E669 Obesity, unspecified: Secondary | ICD-10-CM

## 2016-11-11 DIAGNOSIS — Z1239 Encounter for other screening for malignant neoplasm of breast: Secondary | ICD-10-CM

## 2016-11-11 LAB — HM PAP SMEAR: HM PAP: NORMAL

## 2016-11-11 MED ORDER — PHENTERMINE HCL 37.5 MG PO TABS: 37.5000 mg | ORAL_TABLET | Freq: Every day | ORAL | 0 refills | Status: DC

## 2016-11-11 NOTE — Patient Instructions (Signed)

## 2016-11-11 NOTE — Progress Notes (Signed)
Gynecology Annual Exam  PCP: Gae Dry, MD  Chief Complaint:  Chief Complaint  Patient presents with  . Gynecologic Exam    discuss starting Metformin    History of Present Illness: Patient is a 36 y.o. [id number] presents for annual exam. The patient has no complaints today.   LMP: No LMP recorded. Patient is not currently having periods (Reason: IUD).  The patient is sexually active. She currently uses IUD for contraception. She denies dyspareunia.  The patient does perform self breast exams.  There is notable family history of breast or ovarian cancer in her family (prior [facility] testing negative).  The patient wears seatbelts: yes.   The patient has regular exercise: not asked.  The patient denies current symptoms of depression.     Patientis a 36 y.o. [id number] female, who presents for the evaluation of weight gain. She has remained relatively stable over the past 2 years but has been unable to loose weight. The patient states the following issues have contributed to her weight problem:new job, less physical activity.  The patient has no additional symptoms. The patient specifically denies memory loss, muscle weakness, excessive thirst, and polyuria. Weight related co-morbidities include none. The patient's past medical history is non-contributory. She has tried phentermine in the past with good success.   Review of Systems: Review of Systems  Constitutional: Negative for chills and fever.  HENT: Negative for congestion.   Respiratory: Negative for cough and shortness of breath.   Cardiovascular: Negative for chest pain and palpitations.  Gastrointestinal: Negative for abdominal pain, constipation, diarrhea, heartburn, nausea and vomiting.  Genitourinary: Negative for dysuria, frequency and urgency.  Skin: Negative for itching and rash.  Neurological: Negative for dizziness and headaches.  Endo/Heme/Allergies: Negative for polydipsia.  Psychiatric/Behavioral: Negative  for depression.    Past Medical History:  Past Medical History:  Diagnosis Date  . Anemia   . Anxiety   . Bell's palsy    Lt side  . Environmental allergies   . Obesity (BMI 35.0-39.9 without comorbidity)   . Vulvar intraepithelial neoplasia (VIN) grade 3     Past Surgical History:  Past Surgical History:  Procedure Laterality Date  . CESAREAN SECTION    . EYE SURGERY  in college  . VULVA /PERINEUM BIOPSY     VIN 3    Gynecologic History:  No LMP recorded. Patient is not currently having periods (Reason: IUD). Contraception:06/16/2015  IUD (Mirena) Last Pap: Results were:11/10/2013 NIL and HR HPV negative   Obstetric History: [id number]  Family History:  Family History  Problem Relation Age of Onset  . Hypertension Mother   . Diabetes Father   . Hypertension Father   . Diabetes Sister   . Diabetes Maternal Grandmother   . Diabetes Maternal Grandfather   . Breast cancer Paternal Grandmother        26's  . Breast cancer Paternal Uncle 40  . Breast cancer Maternal Aunt 7  . Colon cancer Paternal Uncle     Social History:  Social History   Socioeconomic History  . Marital status: Married    Spouse name: Not on file  . Number of children: 2  . Years of education: Not on file  . Highest education level: Not on file  Social Needs  . Financial resource strain: Not on file  . Food insecurity - worry: Not on file  . Food insecurity - inability: Not on file  . Transportation needs - medical: Not on file  .  Transportation needs - non-medical: Not on file  Occupational History  . Not on file  Tobacco Use  . Smoking status: Never Smoker  . Smokeless tobacco: Never Used  Substance and Sexual Activity  . Alcohol use: No  . Drug use: No  . Sexual activity: Yes    Partners: Male    Birth control/protection: IUD    Comment: Mirena  Other Topics Concern  . Not on file  Social History Narrative  . Not on file    Allergies:  No Known  Allergies  Medications: Prior to Admission medications   Medication Sig Start Date End Date Taking? Authorizing Provider  fexofenadine (ALLEGRA) 180 MG tablet Take by mouth.    [provider]  fluticasone (FLONASE) 50 MCG/ACT nasal spray Place 2 sprays into both nostrils daily.    [provider]  ketoconazole (NIZORAL) 2 % shampoo USE FOR 1-2WEEKS ALLOW TO REMAIN ON DRY SCALP FOR 15MIN THEN Lerna OUT FOLLOWED BY REG SHAMPOO 07/20/16   [provider]  levonorgestrel (MIRENA) 20 MCG/24HR IUD 1 each by Intrauterine route once.    [provider]  Multiple Vitamin (MULTIVITAMIN) tablet Take 1 tablet by mouth daily.    [provider]    Physical Exam Blood pressure 128/88, pulse 80, height 5' 8"  (1.727 m), weight 254 lb (115.2 kg), currently breastfeeding. Body mass index is 38.62 kg/m.  General: NAD HEENT: normocephalic, anicteric Thyroid: no enlargement, no palpable nodules Pulmonary: No increased work of breathing, CTAB Cardiovascular: RRR, distal pulses 2+ Breast: Breast symmetrical, no tenderness, no palpable nodules or masses, no skin or nipple retraction present, no nipple discharge.  No axillary or supraclavicular lymphadenopathy. Abdomen: NABS, soft, non-tender, non-distended.  Umbilicus without lesions.  No hepatomegaly, splenomegaly or masses palpable. No evidence of hernia  Genitourinary:  External: Normal external female genitalia.  Normal urethral meatus, normal Bartholin's and Skene's glands.    Vagina: Normal vaginal mucosa, no evidence of prolapse.    Cervix: Grossly normal in appearance, no bleeding, IUD strings visualized  Uterus: Non-enlarged, mobile, normal contour.  No CMT  Adnexa: ovaries non-enlarged, no adnexal masses  Rectal: deferred  Lymphatic: no evidence of inguinal lymphadenopathy Extremities: no edema, erythema, or tenderness Neurologic: Grossly intact Psychiatric: mood appropriate, affect full  Female  chaperone present for pelvic and breast  portions of the physical exam    Assessment: 36 y.o. [id number] routine annual exam and medical weight loss visit  Plan: Problem List Items Addressed This Visit    None    Visit Diagnoses    Screening for malignant neoplasm of cervix       Relevant Orders   PapIG, HPV, rfx 16/18   Breast screening       Relevant Orders   PapIG, HPV, rfx 16/18   Encounter for gynecological examination without abnormal finding       Relevant Orders   PapIG, HPV, rfx 16/18     Annual  1) STI screening was offered and declined  2) ASCCP guidelines and rational discussed.  Patient opts for every 3 years screening interval  3) Contraception - Continue IUD, placed 06/16/2015  4) Routine healthcare maintenance including cholesterol, diabetes screening discussed managed by PCP  5) Prior MyRisk drawn 11/06/2015 - negative for BRCA, ATM VUS [id number] (p.[id number]). - Tyrer-Cuzick Breast Cancer risk lifetime 27.6%  6) Follow up 1 year for routine annual exam  Medical Weight loss  1) 1500 Calorie ADA Diet  2) Patient education given regarding appropriate lifestyle changes for  weight loss including: regular physical activity, healthy coping strategies, caloric restriction and healthy eating patterns.  3) Patient will be started on weight loss medication. The risks and benefits and side effects of medication, such as Adipex (Phenteramine) ,  Tenuate (Diethylproprion), Belviq (lorcarsin), Contrave (buproprion/naltrexone), Qsymia (phentermine/topiramate), and Saxenda (liraglutide) is discussed. The pros and cons of suppressing appetite and boosting metabolism is discussed. Risks of tolerence and addiction is discussed for selected agents discussed. Use of medicine will ne short term, such as 3-4 months at a time followed by a period of time off of the medicine to avoid these risks and side effects for Adipex, Qsymia, and Tenuate discussed. Pt to call with any negative  side effects and agrees to keep follow up appts. - start phentermine  4) Comorbidity Screening - hypothyroidism screening, diabetes, and hyperlipidemia screening offered  5) Encouraged weekly weight monitorig to track progress and sample 1 week food diary  6) Contraception - discussed that all weight loss drugs fall in to pregnancy category X, patient currently has reliable contraception in the form of IUD  7) 15 minutes face-to-face; counseling/coordination of care > 50 percent of visit  8) Follow up in 4 weeks to assess response

## 2016-11-12 ENCOUNTER — Encounter: Payer: Self-pay | Admitting: Obstetrics and Gynecology

## 2016-11-12 LAB — HEMOGLOBIN A1C
Est. average glucose Bld gHb Est-mCnc: 126 mg/dL
Hgb A1c MFr Bld: 6 % — ABNORMAL HIGH (ref 4.8–5.6)

## 2016-11-12 LAB — TSH: TSH: 1.21 u[IU]/mL (ref 0.450–4.500)

## 2016-11-13 LAB — PAPIG, HPV, RFX 16/18
HPV, HIGH-RISK: NEGATIVE
PAP SMEAR COMMENT: 0

## 2016-11-19 ENCOUNTER — Encounter: Payer: Self-pay | Admitting: Obstetrics and Gynecology

## 2016-11-20 ENCOUNTER — Telehealth: Payer: Self-pay | Admitting: Obstetrics & Gynecology

## 2016-11-20 NOTE — Telephone Encounter (Signed)
-----   Message from Vena AustriaAndreas Staebler, MD sent at 11/19/2016  2:14 PM EST ----- Regarding: Work in Work in tomorrow or Friday additional screening ([place] [employer]

## 2016-11-20 NOTE — Telephone Encounter (Signed)
Called and lvm for patient to call back to be scheduled. Ok per AMS to over book

## 2016-11-21 ENCOUNTER — Other Ambulatory Visit: Payer: Self-pay | Admitting: Obstetrics and Gynecology

## 2016-11-21 MED ORDER — METRONIDAZOLE 500 MG PO TABS: 2000.0000 mg | ORAL_TABLET | Freq: Once | ORAL | 0 refills | Status: AC

## 2016-11-21 NOTE — Telephone Encounter (Signed)
Called left voicemail for patient to call back to be schedule °

## 2016-11-21 NOTE — Progress Notes (Signed)
Trichmonas rx called in partner treated as well

## 2017-01-27 DIAGNOSIS — R7303 Prediabetes: Secondary | ICD-10-CM | POA: Diagnosis not present

## 2017-03-25 DIAGNOSIS — R6889 Other general symptoms and signs: Secondary | ICD-10-CM | POA: Diagnosis not present

## 2017-03-25 DIAGNOSIS — J069 Acute upper respiratory infection, unspecified: Secondary | ICD-10-CM | POA: Diagnosis not present

## 2017-05-13 DIAGNOSIS — L74510 Primary focal hyperhidrosis, axilla: Secondary | ICD-10-CM | POA: Diagnosis not present

## 2017-05-19 DIAGNOSIS — H6691 Otitis media, unspecified, right ear: Secondary | ICD-10-CM | POA: Diagnosis not present

## 2017-05-23 ENCOUNTER — Ambulatory Visit: Payer: 59 | Admitting: Obstetrics and Gynecology

## 2017-05-23 ENCOUNTER — Ambulatory Visit (INDEPENDENT_AMBULATORY_CARE_PROVIDER_SITE_OTHER): Payer: 59 | Admitting: Obstetrics and Gynecology

## 2017-05-23 ENCOUNTER — Encounter: Payer: Self-pay | Admitting: Obstetrics and Gynecology

## 2017-05-23 VITALS — BP 120/60 | HR 92 | Ht 68.0 in | Wt 234.0 lb

## 2017-05-23 DIAGNOSIS — L0292 Furuncle, unspecified: Secondary | ICD-10-CM | POA: Diagnosis not present

## 2017-05-23 MED ORDER — SULFAMETHOXAZOLE-TRIMETHOPRIM 800-160 MG PO TABS: 1.0000 | ORAL_TABLET | Freq: Two times a day (BID) | ORAL | 0 refills | Status: AC

## 2017-05-23 NOTE — Progress Notes (Signed)
Obstetrics & Gynecology Office Visit   Chief Complaint:  Chief Complaint  Patient presents with  . Right breast check    Pimple looking spot superficial/inflammed    History of Present Illness: 37 year old presenting with several day history of right breast lesion.  The lesion is located on the underside of the right breast near.   It did drain initially.  It is tender to touch.  No fevers, no chills.  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  Her daughter did recently have MRSA which worries her that this may be MRSA related soft tissue infection.     Review of Systems: Review of Systems  Constitutional: Negative for chills, fever and weight loss.  Skin: Negative for itching and rash.     Past Medical History:  Past Medical History:  Diagnosis Date  . Anemia   . Anxiety   . Bell's palsy    Lt side  . Environmental allergies   . Obesity (BMI 35.0-39.9 without comorbidity)   . Vulvar intraepithelial neoplasia (VIN) grade 3     Past Surgical History:  Past Surgical History:  Procedure Laterality Date  . CESAREAN SECTION    . CESAREAN SECTION N/A 04/18/2015   Procedure: CESAREAN SECTION;  Surgeon: Conard Novak, MD;  Location: [facility] ORS;  Service: Obstetrics;  Laterality: N/A;  . EYE SURGERY  in college  . VULVA /PERINEUM BIOPSY     VIN 3    Gynecologic History: No LMP recorded. (Menstrual status: IUD).  Obstetric History: [id number]  Family History:  Family History  Problem Relation Age of Onset  . Hypertension Mother   . Diabetes Father   . Hypertension Father   . Diabetes Sister   . Diabetes Maternal Grandmother   . Diabetes Maternal Grandfather   . Breast cancer Paternal Grandmother        43's  . Breast cancer Paternal Uncle 51  . Breast cancer Maternal Aunt 45  . Colon cancer Paternal Uncle     Social History:  Social History   Socioeconomic History  . Marital status: Married    Spouse name: Not on file  . Number of  children: 2  . Years of education: Not on file  . Highest education level: Not on file  Occupational History  . Not on file  Social Needs  . Financial resource strain: Not on file  . Food insecurity:    Worry: Not on file    Inability: Not on file  . Transportation needs:    Medical: Not on file    Non-medical: Not on file  Tobacco Use  . Smoking status: Never Smoker  . Smokeless tobacco: Never Used  Substance and Sexual Activity  . Alcohol use: No  . Drug use: No  . Sexual activity: Yes    Partners: Male    Birth control/protection: IUD    Comment: Mirena  Lifestyle  . Physical activity:    Days per week: Not on file    Minutes per session: Not on file  . Stress: Not on file  Relationships  . Social connections:    Talks on phone: Not on file    Gets together: Not on file    Attends religious service: Not on file    Active member of club or organization: Not on file    Attends meetings of clubs or organizations: Not on file    Relationship status: Not on file  . Intimate  partner violence:    Fear of current or ex partner: Not on file    Emotionally abused: Not on file    Physically abused: Not on file    Forced sexual activity: Not on file  Other Topics Concern  . Not on file  Social History Narrative  . Not on file    Allergies:  No Known Allergies  Medications: Prior to Admission medications   Medication Sig Start Date End Date Taking? Authorizing Provider  cetirizine (ZYRTEC) 10 MG chewable tablet Chew 10 mg by mouth daily.   Yes [provider]  fluticasone (FLONASE) 50 MCG/ACT nasal spray Place 2 sprays into both nostrils daily.   Yes [provider]  ketoconazole (NIZORAL) 2 % shampoo USE FOR 1-2WEEKS ALLOW TO REMAIN ON DRY SCALP FOR THEN WASH OUT FOLLOWED BY REG SHAMPOO 07/20/16  Yes [provider]  levonorgestrel (MIRENA) 20 MCG/24HR IUD 1 each by Intrauterine route once.   Yes [provider]  Multiple  Vitamin (MULTIVITAMIN) tablet Take 1 tablet by mouth daily.   Yes [provider]  fexofenadine (ALLEGRA) 180 MG tablet Take by mouth.    [provider]  phentermine (ADIPEX-P) 37.5 MG tablet Take 1 tablet (37.5 mg total) daily before breakfast by mouth. Patient not taking: Reported on 05/23/2017 11/11/16   Vena Austria, MD  sulfamethoxazole-trimethoprim (BACTRIM DS) 800-160 MG tablet Take 1 tablet by mouth 2 (two) times daily for 10 days. 05/23/17 06/02/17  Vena Austria, MD    Physical Exam Vitals:  Vitals:   05/23/17 1613  BP: 120/60  Pulse: 92   No LMP recorded. (Menstrual status: IUD).  General: NAD HEENT: normocephalic, anicteric Breast: Breast symmetrical, no skin or retraction present, small furuncle underside right breast at site at 4 O'Clock lymphadenopathy. Pulmonary: No increased work of breathing Neurologic: Grossly intact Psychiatric: mood appropriate, affect full   Assessment: 37 y.o. Z6X0960 with furuncle right breastk  Plan: Problem List Items Addressed This Visit    None    Visit Diagnoses    Furuncle    -  Primary   Relevant Medications   sulfamethoxazole-trimethoprim (BACTRIM DS) 800-160 MG tablet     - Furuncle - warm compresses, topical antibacterial ointments such as neosporin ok to use.  No currently draining or amenable to culture.  Bactrim rx written for empiric MRSA coverage  - Return if symptoms worsen or fail to improve.   Vena Austria, MD, [facility], [facility] 05/23/2017, 4:45 PM

## 2017-07-21 DIAGNOSIS — R7303 Prediabetes: Secondary | ICD-10-CM | POA: Diagnosis not present

## 2017-07-21 DIAGNOSIS — Z0000 Encounter for general adult medical examination without abnormal findings: Secondary | ICD-10-CM | POA: Diagnosis not present

## 2017-07-22 DIAGNOSIS — R7303 Prediabetes: Secondary | ICD-10-CM | POA: Diagnosis not present

## 2017-07-22 DIAGNOSIS — Z0000 Encounter for general adult medical examination without abnormal findings: Secondary | ICD-10-CM | POA: Diagnosis not present

## 2017-11-12 ENCOUNTER — Ambulatory Visit: Payer: 59 | Admitting: Certified Nurse Midwife

## 2017-11-14 ENCOUNTER — Encounter: Payer: Self-pay | Admitting: Obstetrics and Gynecology

## 2017-11-14 ENCOUNTER — Ambulatory Visit (INDEPENDENT_AMBULATORY_CARE_PROVIDER_SITE_OTHER): Payer: 59 | Admitting: Obstetrics and Gynecology

## 2017-11-14 VITALS — BP 122/74 | Ht 68.0 in | Wt 228.0 lb

## 2017-11-14 DIAGNOSIS — Z6834 Body mass index (BMI) 34.0-34.9, adult: Secondary | ICD-10-CM

## 2017-11-14 DIAGNOSIS — Z01419 Encounter for gynecological examination (general) (routine) without abnormal findings: Secondary | ICD-10-CM | POA: Diagnosis not present

## 2017-11-14 DIAGNOSIS — Z1331 Encounter for screening for depression: Secondary | ICD-10-CM

## 2017-11-14 DIAGNOSIS — Z1339 Encounter for screening examination for other mental health and behavioral disorders: Secondary | ICD-10-CM

## 2017-11-14 DIAGNOSIS — E6609 Other obesity due to excess calories: Secondary | ICD-10-CM

## 2017-11-14 DIAGNOSIS — Z113 Encounter for screening for infections with a predominantly sexual mode of transmission: Secondary | ICD-10-CM

## 2017-11-14 NOTE — Progress Notes (Signed)
Gynecology Annual Exam  PCP: Nadara Mustard, MD  Chief Complaint  Patient presents with  . Annual Exam   History of Present Illness:  Ms. Catherine Montgomery is a 37 y.o. Z6X0960 who LMP was Patient's last menstrual period was 10/14/2017., presents today for her annual examination.  Her menses are more like spotting and are sporadic.   She is single partner, contraception - IUD.  Last Pap: 1 year  Results were: no abnormalities /neg HPV DNA negative Hx of STDs: trichomonas  Last mammogram:   n/a There is a FH of breast cancer. She has been tested before for genetic mutations. There is no FH of ovarian cancer. The patient does not do self-breast exams.  Tobacco use: The patient denies current or previous tobacco use. Alcohol use: social drinker Exercise: moderately active  She has concerns regarding her weight. She has lost some weight (260 lb - > 238 lb).  For weight loss in the past, she has tried calorie counting.  She has small, scattered meals and one larger meal.  She has tried no medication.  She has been prescribed medication in the past, but she has not taken the medication.   She feels like she has hit a plateau.  Also, her hemoglobin A1c is slowly rising despite her improvement in weight.   The patient wears seatbelts: yes.      Past Medical History:  Diagnosis Date  . Anemia   . Anxiety   . Bell's palsy    Lt side  . Environmental allergies   . Obesity (BMI 35.0-39.9 without comorbidity)   . Vulvar intraepithelial neoplasia (VIN) grade 3     Past Surgical History:  Procedure Laterality Date  . CESAREAN SECTION    . CESAREAN SECTION N/A 04/18/2015   Procedure: CESAREAN SECTION;  Surgeon: Conard Novak, MD;  Location: [facility] ORS;  Service: Obstetrics;  Laterality: N/A;  . EYE SURGERY  in college  . VULVA /PERINEUM BIOPSY     VIN 3    Prior to Admission medications   Medication Sig Start Date End Date Taking? Authorizing Provider  cetirizine (ZYRTEC) 10 MG  chewable tablet Chew 10 mg by mouth daily.   Yes [provider]  ketoconazole (NIZORAL) 2 % shampoo USE FOR 1-2WEEKS ALLOW TO REMAIN ON DRY SCALP FOR THEN WASH OUT FOLLOWED BY REG SHAMPOO 07/20/16  Yes [provider]  levonorgestrel (MIRENA) 20 MCG/24HR IUD 1 each by Intrauterine route once.   Yes [provider]  Multiple Vitamin (MULTIVITAMIN) tablet Take 1 tablet by mouth daily.   Yes [provider]  fexofenadine (ALLEGRA) 180 MG tablet Take by mouth.    [provider]  fluticasone (FLONASE) 50 MCG/ACT nasal spray Place 2 sprays into both nostrils daily.    [provider]   Allergies: No Known Allergies  Gynecologic History: Patient's last menstrual period was 10/14/2017.  Obstetric History: [id number]  Social History   Socioeconomic History  . Marital status: Married    Spouse name: Not on file  . Number of children: 2  . Years of education: Not on file  . Highest education level: Not on file  Occupational History  . Not on file  Social Needs  . Financial resource strain: Not on file  . Food insecurity:    Worry: Not on file    Inability: Not on file  . Transportation needs:    Medical: Not on file    Non-medical: Not on file  Tobacco  Use  . Smoking status: Never Smoker  . Smokeless tobacco: Never Used  Substance and Sexual Activity  . Alcohol use: No  . Drug use: No  . Sexual activity: Yes    Partners: Male    Birth control/protection: IUD    Comment: Mirena  Lifestyle  . Physical activity:    Days per week: Not on file    Minutes per session: Not on file  . Stress: Not on file  Relationships  . Social connections:    Talks on phone: Not on file    Gets together: Not on file    Attends religious service: Not on file    Active member of club or organization: Not on file    Attends meetings of clubs or organizations: Not on file    Relationship status: Not on file  . Intimate partner violence:     Fear of current or ex partner: Not on file    Emotionally abused: Not on file    Physically abused: Not on file    Forced sexual activity: Not on file  Other Topics Concern  . Not on file  Social History Narrative  . Not on file    Family History  Problem Relation Age of Onset  . Hypertension Mother   . Diabetes Father   . Hypertension Father   . Diabetes Sister   . Diabetes Maternal Grandmother   . Diabetes Maternal Grandfather   . Breast cancer Paternal Grandmother        23's  . Breast cancer Paternal Uncle 36  . Breast cancer Maternal Aunt 45  . Colon cancer Paternal Uncle     Review of Systems  Constitutional: Negative.   HENT: Negative.   Eyes: Negative.   Respiratory: Negative.   Cardiovascular: Negative.   Gastrointestinal: Negative.   Genitourinary: Negative.   Musculoskeletal: Negative.   Skin: Negative.   Neurological: Negative.   Psychiatric/Behavioral: Negative.      Physical Exam BP 122/74   Ht 5\' 8"  (1.727 m)   Wt 228 lb (103.4 kg)   LMP 10/14/2017   BMI 34.67 kg/m    Physical Exam  Constitutional: She is oriented to person, place, and time. She appears well-developed and well-nourished. No distress.  Genitourinary: Uterus normal. Pelvic exam was performed with patient supine. There is no rash, tenderness, lesion or injury on the right labia. There is no rash, tenderness, lesion or injury on the left labia. No erythema, tenderness or bleeding in the vagina. No signs of injury around the vagina. No vaginal discharge found. Right adnexum does not display mass, does not display tenderness and does not display fullness. Left adnexum does not display mass, does not display tenderness and does not display fullness. Cervix does not exhibit motion tenderness, lesion, discharge or polyp.   Uterus is mobile and anteverted. Uterus is not enlarged, tender or exhibiting a mass.  HENT:  Head: Normocephalic and atraumatic.  Eyes: EOM are normal. No scleral  icterus.  Neck: Normal range of motion. Neck supple. No thyromegaly present.  Cardiovascular: Normal rate and regular rhythm. Exam reveals no gallop and no friction rub.  No murmur heard. Pulmonary/Chest: Effort normal and breath sounds normal. No respiratory distress. She has no wheezes. She has no rales. Right breast exhibits no inverted nipple, no mass, no nipple discharge, no skin change and no tenderness. Left breast exhibits no inverted nipple, no mass, no nipple discharge, no skin change and no tenderness.  Abdominal: Soft. Bowel sounds are normal.  She exhibits no distension and no mass. There is no tenderness. There is no rebound and no guarding.  Musculoskeletal: Normal range of motion. She exhibits no edema or tenderness.  Lymphadenopathy:    She has no cervical adenopathy.       Right: No inguinal adenopathy present.       Left: No inguinal adenopathy present.  Neurological: She is alert and oriented to person, place, and time. No cranial nerve deficit.  Skin: Skin is warm and dry. No rash noted. No erythema.  Psychiatric: She has a normal mood and affect. Her behavior is normal. Judgment normal.   Female chaperone present for pelvic and breast  portions of the physical exam  Results: AUDIT Questionnaire (screen for alcoholism): 1 PHQ-9: 3  Assessment: 37 y.o. Z6X0960 female here for routine annual gynecologic examination.  Plan: Problem List Items Addressed This Visit    None    Visit Diagnoses    Women's annual routine gynecological examination    -  Primary   Relevant Orders   Chlamydia/Gonococcus/Trichomonas, NAA   Screening for depression       Screening for alcoholism       Screen for STD (sexually transmitted disease)       Relevant Orders   Chlamydia/Gonococcus/Trichomonas, NAA   Class 1 obesity due to excess calories without serious comorbidity with body mass index (BMI) of 34.0 to 34.9 in adult         Screening: -- Blood pressure screen normal --  Colonoscopy - not due -- Mammogram - not due -- Weight screening: obese: discussed management options, including lifestyle, dietary, and exercise.    Will start Saxenda, samples given.  -- Depression screening negative (PHQ-9) -- Nutrition: normal -- cholesterol screening: not due for screening -- osteoporosis screening: not due -- tobacco screening: not using -- alcohol screening: AUDIT questionnaire indicates low-risk usage. -- family history of breast cancer screening: done. not at high risk. -- no evidence of domestic violence or intimate partner violence. -- STD screening: gonorrhea/chlamydia NAAT collected -- pap smear not collected per ASCCP guidelines -- flu vaccine received at work -- HPV vaccination series: not eligilbe  - Will have her follow up in 3 months if stays on Saxenda  Thomasene Mohair, MD 11/14/2017 5:30 PM

## 2017-11-18 LAB — CHLAMYDIA/GONOCOCCUS/TRICHOMONAS, NAA
Chlamydia by NAA: NEGATIVE
Gonococcus by NAA: NEGATIVE
Trich vag by NAA: NEGATIVE

## 2018-06-09 ENCOUNTER — Ambulatory Visit (INDEPENDENT_AMBULATORY_CARE_PROVIDER_SITE_OTHER): Payer: 59 | Admitting: Obstetrics and Gynecology

## 2018-06-09 ENCOUNTER — Other Ambulatory Visit: Payer: Self-pay

## 2018-06-09 ENCOUNTER — Ambulatory Visit: Payer: 59 | Admitting: Certified Nurse Midwife

## 2018-06-09 ENCOUNTER — Encounter: Payer: Self-pay | Admitting: Obstetrics and Gynecology

## 2018-06-09 VITALS — BP 128/74 | Ht 68.0 in | Wt 228.0 lb

## 2018-06-09 DIAGNOSIS — E6609 Other obesity due to excess calories: Secondary | ICD-10-CM | POA: Diagnosis not present

## 2018-06-09 DIAGNOSIS — Z6834 Body mass index (BMI) 34.0-34.9, adult: Secondary | ICD-10-CM | POA: Diagnosis not present

## 2018-06-09 MED ORDER — DIETHYLPROPION HCL ER 75 MG PO TB24: 1.0000 | ORAL_TABLET | Freq: Every day | ORAL | 0 refills | Status: DC

## 2018-06-09 NOTE — Progress Notes (Signed)
Gynecology Office Visit  Chief Complaint:  Chief Complaint  Patient presents with  . Follow-up  . Weight Check   History of Present Illness: Patientis a 38 y.o. [id number] female, who presents for the evaluation of obesity. She has gained zero pounds primarily over the past 6 months. The patient states the following issues have contributed to her weight problem: lack of results from diet and inability to exercise due to Covid-19.  The patient has no additional symptoms. The patient specifically denies memory loss, muscle weakness, excessive thirst, and polyuria. Weight related co-morbidities include: none. The patient's past medical history is notable for none. She has tried [place] in the past with some success. However, she had to self-discontinue due to the side effect of nausea.  She also would like me to take a look at her external vagina. She has no particular issues. She just wonders if it is normal. 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   Past Medical History:  Past Medical History:  Diagnosis Date  . Anemia   . Anxiety   . Bell's palsy    Lt side  . Environmental allergies   . Obesity (BMI 35.0-39.9 without comorbidity)   . Vulvar intraepithelial neoplasia (VIN) grade 3     Past Surgical History:  Past Surgical History:  Procedure Laterality Date  . CESAREAN SECTION    . CESAREAN SECTION N/A 04/18/2015   Procedure: CESAREAN SECTION;  Surgeon: Conard Novak, MD;  Location: [facility] ORS;  Service: Obstetrics;  Laterality: N/A;  . EYE SURGERY  in college  . VULVA /PERINEUM BIOPSY     VIN 3    Medications: Prior to Admission medications   Medication Sig Start Date End Date Taking? Authorizing Provider  cetirizine (ZYRTEC) 10 MG chewable tablet Chew 10 mg by mouth daily.   Yes [provider]  fluticasone (FLONASE) 50 MCG/ACT nasal spray Place 2 sprays into both nostrils daily.   Yes [provider]  ketoconazole (NIZORAL) 2 % shampoo USE FOR 1-2WEEKS  ALLOW TO REMAIN ON DRY SCALP FOR THEN WASH OUT FOLLOWED BY REG SHAMPOO 07/20/16  Yes [provider]  levonorgestrel (MIRENA) 20 MCG/24HR IUD 1 each by Intrauterine route once.   Yes [provider]  Multiple Vitamin (MULTIVITAMIN) tablet Take 1 tablet by mouth daily.   Yes [provider]    Allergies:  No Known Allergies  Gynecologic History: No LMP recorded. (Menstrual status: IUD).  Obstetric History: [id number]  Family History:  Family History  Problem Relation Age of Onset  . Hypertension Mother   . Diabetes Father   . Hypertension Father   . Diabetes Sister   . Diabetes Maternal Grandmother   . Diabetes Maternal Grandfather   . Breast cancer Paternal Grandmother        47's  . Breast cancer Paternal Uncle 19  . Breast cancer Maternal Aunt 45  . Colon cancer Paternal Uncle     Social History:  Social History   Socioeconomic History  . Marital status: Married    Spouse name: Not on file  . Number of children: 2  . Years of education: Not on file  . Highest education level: Not on file  Occupational History  . Not on file  Social Needs  . Financial resource strain: Not on file  . Food insecurity:    Worry: Not on file    Inability: Not on file  . Transportation needs:    Medical: Not on  file    Non-medical: Not on file  Tobacco Use  . Smoking status: Never Smoker  . Smokeless tobacco: Never Used  Substance and Sexual Activity  . Alcohol use: No  . Drug use: No  . Sexual activity: Yes    Partners: Male    Birth control/protection: I.U.D.    Comment: Mirena  Lifestyle  . Physical activity:    Days per week: Not on file    Minutes per session: Not on file  . Stress: Not on file  Relationships  . Social connections:    Talks on phone: Not on file    Gets together: Not on file    Attends religious service: Not on file    Active member of club or organization: Not on file    Attends meetings of clubs or organizations: Not  on file    Relationship status: Not on file  . Intimate partner violence:    Fear of current or ex partner: Not on file    Emotionally abused: Not on file    Physically abused: Not on file    Forced sexual activity: Not on file  Other Topics Concern  . Not on file  Social History Narrative  . Not on file     Review of Systems: Review of Systems  Constitutional: Negative.   HENT: Negative.   Eyes: Negative.   Respiratory: Negative.   Cardiovascular: Negative.   Gastrointestinal: Negative.   Genitourinary: Negative.   Musculoskeletal: Negative.   Skin: Negative.   Neurological: Negative.   Psychiatric/Behavioral: Negative.     Physical Exam BP 128/74   Ht 5\' 8"  (1.727 m)   Wt 228 lb (103.4 kg)   BMI 34.67 kg/m   No LMP recorded. (Menstrual status: IUD). Physical Exam Exam conducted with a chaperone present.  Constitutional:      General: She is not in acute distress.    Appearance: Normal appearance.  HENT:     Head: Normocephalic and atraumatic.  Eyes:     General: No scleral icterus.    Conjunctiva/sclera: Conjunctivae normal.  Pulmonary:     Effort: Pulmonary effort is normal.  Genitourinary:    General: Normal vulva.     Exam position: Lithotomy position.     Pubic Area: No rash.      Labia:        Right: No rash, tenderness, lesion or injury.        Left: No rash, tenderness, lesion or injury.   Musculoskeletal: Normal range of motion.        General: No swelling.  Lymphadenopathy:     Lower Body: No right inguinal adenopathy. No left inguinal adenopathy.  Skin:    General: Skin is warm and dry.     Coloration: Skin is not jaundiced.  Neurological:     General: No focal deficit present.     Mental Status: She is alert and oriented to person, place, and time.     Cranial Nerves: No cranial nerve deficit.  Psychiatric:        Mood and Affect: Mood normal.        Behavior: Behavior normal.        Judgment: Judgment normal.    Female chaperone  present for pelvic exam:   Assessment: 38 y.o. Catherine Montgomery No problem-specific Assessment & Plan notes found for this encounter.   Plan: Problem List Items Addressed This Visit    None    Visit Diagnoses    Class 1 obesity due  to excess calories without serious comorbidity with body mass index (BMI) of 34.0 to 34.9 in adult    -  Primary   Relevant Medications   Diethylpropion HCl CR 75 MG TB24      1) 1800 Calorie ADA Diet  2) Patient education given regarding appropriate lifestyle changes for weight loss including: regular physical activity, healthy coping strategies, caloric restriction and healthy eating patterns.  3) Patient will be started on weight loss medication. The risks and benefits and side effects of medication, such as Adipex (Phenteramine) ,  Tenuate (Diethylproprion), Belviq (lorcarsin), Contrave (buproprion/naltrexone), Qsymia (phentermine/topiramate), and Saxenda (liraglutide) is discussed. The pros and cons of suppressing appetite and boosting metabolism is discussed. Risks of tolerence and addiction is discussed for selected agents discussed. Use of medicine will ne short term, such as 3-4 months at a time followed by a period of time off of the medicine to avoid these risks and side effects for Adipex, Qsymia, and Tenuate discussed. Pt to call with any negative side effects and agrees to keep follow up appts.  4) Comorbidity Screening - hypothyroidism screening, diabetes, and hyperlipidemia screening offered  5) Encouraged weekly weight monitorig to track progress and sample 1 week food diary  6) Contraception - discussed that all weight loss drugs fall in to pregnancy category X, patient currently has reliable contraception in the form of Mirena IUD  7) Reassured patient regarding normal anatomy. She has no evidence of recurrent VIN.  8) Follow up in 4 weeks to assess response. May be accomplished by telephone, as she is a highly reliable patient.  15 minutes spent  in face to face discussion with > 50% spent in counseling,management, and coordination of care of her Class 1 obesity with BMI 34.   Thomasene MohairStephen Azul Brumett, MD 06/09/2018 5:52 PM

## 2018-09-25 ENCOUNTER — Ambulatory Visit (INDEPENDENT_AMBULATORY_CARE_PROVIDER_SITE_OTHER): Payer: 59 | Admitting: Obstetrics and Gynecology

## 2018-09-25 ENCOUNTER — Encounter: Payer: Self-pay | Admitting: Obstetrics and Gynecology

## 2018-09-25 ENCOUNTER — Other Ambulatory Visit: Payer: Self-pay

## 2018-09-25 VITALS — BP 122/74 | Ht 68.0 in | Wt 233.0 lb

## 2018-09-25 DIAGNOSIS — Z6835 Body mass index (BMI) 35.0-35.9, adult: Secondary | ICD-10-CM

## 2018-09-25 DIAGNOSIS — Z30432 Encounter for removal of intrauterine contraceptive device: Secondary | ICD-10-CM | POA: Diagnosis not present

## 2018-09-25 DIAGNOSIS — E6609 Other obesity due to excess calories: Secondary | ICD-10-CM | POA: Diagnosis not present

## 2018-09-25 MED ORDER — DIETHYLPROPION HCL ER 75 MG PO TB24: 1.0000 | ORAL_TABLET | Freq: Every day | ORAL | 0 refills | Status: DC

## 2018-09-25 NOTE — Progress Notes (Signed)
Obstetrics & Gynecology Office Visit   Chief Complaint  Patient presents with   Contraception    IUD removal  Weight loss and IUD removal  History of Present Illness: Patientis a 38 y.o. [id number] female, who presents for the evaluation of weight gain. She has gained 5 pounds primarily over 3 months. The patient states the following issues have contributed to her weight problem: sedentary lifestyle and difficulty losing weight although she has increased her level of exercise and improve her diet.  The patient has no additional symptoms. The patient specifically denies memory loss, muscle weakness, excessive thirst, and polyuria. Weight related co-morbidities include none. The patient's past medical history is notable for no major issues. She has tried multiple types of interventions in the past with moderate success.    She would also like to have her IUD removed.  She has had her IUD for about 3 years.  She is unsure whether she wants to have another child.  However, she does not want to wait until she is too old to have a child.  Her husband has been through a recent back surgery and so she will likely be unable to try for several months.  Per her husband's doctors instructions, he should not attempt intercourse for about 6 weeks due to his recent back surgery.  We discussed in great detail the risks of taking weight loss medication while attempting pregnancy.  She voiced understanding and still a desire to have the IUD removed.   Past Medical History:  Diagnosis Date   Anemia    Anxiety    Bell's palsy    Lt side   Environmental allergies    Obesity (BMI 35.0-39.9 without comorbidity)    Vulvar intraepithelial neoplasia (VIN) grade 3     Past Surgical History:  Procedure Laterality Date   CESAREAN SECTION     CESAREAN SECTION N/A 04/18/2015   Procedure: CESAREAN SECTION;  Surgeon: Conard Novak, MD;  Location: [facility] ORS;  Service: Obstetrics;  Laterality: N/A;   EYE  SURGERY  in college   VULVA /PERINEUM BIOPSY     VIN 3    Gynecologic History: No LMP recorded. (Menstrual status: IUD).  Obstetric History: [id number]  Family History  Problem Relation Age of Onset   Hypertension Mother    Diabetes Father    Hypertension Father    Diabetes Sister    Diabetes Maternal Grandmother    Diabetes Maternal Grandfather    Breast cancer Paternal Grandmother        42's   Breast cancer Paternal Uncle 83   Breast cancer Maternal Aunt 65   Colon cancer Paternal Uncle     Social History   Socioeconomic History   Marital status: Married    Spouse name: Not on file   Number of children: 2   Years of education: Not on file   Highest education level: Not on file  Occupational History   Not on file  Social Needs   Financial resource strain: Not on file   Food insecurity    Worry: Not on file    Inability: Not on file   Transportation needs    Medical: Not on file    Non-medical: Not on file  Tobacco Use   Smoking status: Never Smoker   Smokeless tobacco: Never Used  Substance and Sexual Activity   Alcohol use: No   Drug use: No   Sexual activity: Yes    Partners: Male    Birth control/protection: I.U.D.  Comment: Mirena  Lifestyle   Physical activity    Days per week: Not on file    Minutes per session: Not on file   Stress: Not on file  Relationships   Social connections    Talks on phone: Not on file    Gets together: Not on file    Attends religious service: Not on file    Active member of club or organization: Not on file    Attends meetings of clubs or organizations: Not on file    Relationship status: Not on file   Intimate partner violence    Fear of current or ex partner: Not on file    Emotionally abused: Not on file    Physically abused: Not on file    Forced sexual activity: Not on file  Other Topics Concern   Not on file  Social History Narrative   Not on file    No Known  Allergies  Prior to Admission medications   Medication Sig Start Date End Date Taking? Authorizing Provider  cetirizine (ZYRTEC) 10 MG chewable tablet Chew 10 mg by mouth daily.    [provider]  Diethylpropion HCl CR 75 MG TB24 Take 1 tablet (75 mg total) by mouth daily before breakfast. 06/09/18   Jshon Ibe D, MD  fluticasone (FLONASE) 50 MCG/ACT nasal spray Place 2 sprays into both nostrils daily.    [provider]  ketoconazole (NIZORAL) 2 % shampoo USE FOR 1-2WEEKS ALLOW TO REMAIN ON DRY SCALP FOR <MEASUREMRedmond Regional Me(608000Theresia MajorsKentuckyKentu7404899114ky1KentuckyVirg81[place]518<MEASUREMENSuffolk Surger978000Theresia [phone]518<MEASUREMENDallas Cou318000Theresia [phone]518<MEASUREMENLindsay House Surger(912000Theresia MajorsKentuckyKentu7325032773ky1[place]Virg81[place]518<MEASUREMENMartin Gene562000Theresia [phone]518<MEASUREMENFlorida Outpatient Surger(715000Theresia [phone]518<MEASUREMENWil[facility]([phone])000Theresia [phone]518<MEASUREMENW[facility] Ce575000Theresia [phone]518<MEASUREMENHudson Regio651000Theresia [phone]518<MEASUREMEN(402)000Theresia [phone]518<MEASUREMENPal[place]304000Theresia [phone]y518<MEASUREMENUw Medicine Valley Me931000Theresia [phone]y518<MEASUREMENAbrazo Mar(520000Theresia [phone]y518<MEASUREMEN[facility]he(712)000Theresia [phone]518<MEASUREMENBakersfield Memorial Hospital-938000Theresia MajorsKentuckyKentu([phone]518<MEASUREMENEl [facility]ador [facility] Su734000Theresia [phone]518<MEASUREMEN838 805 4175oman'S HospitalUT FOLLOWED BY REG SHAMPOO 07/20/16   [provider]  levonorgestrel (MIRENA) 20 MCG/24HR IUD 1 each by Intrauterine route once.    [provider]  Multiple Vitamin (MULTIVITAMIN) tablet Take 1 tablet by mouth daily.    [provider]    Review of Systems  Constitutional: Negative.        Weight gain  HENT: Negative.   Eyes: Negative.   Respiratory: Negative.   Cardiovascular: Negative.   Gastrointestinal: Negative.   Genitourinary: Negative.   Musculoskeletal: Negative.   Skin: Negative.   Neurological: Negative.   Psychiatric/Behavioral: Negative.      Physical Exam BP 122/74    Ht 5\' 8"  (1.727 m)    Wt 233 lb (105.7 kg)    BMI 35.43 kg/m  No LMP recorded. (Menstrual status: IUD). Physical Exam Constitutional:      General: She is not in acute distress.    Appearance: Normal appearance. She is well-developed.  Genitourinary:     Pelvic exam was performed with patient in the lithotomy position.     Vulva, inguinal canal, urethra, bladder, vagina, uterus, right adnexa and left adnexa normal.     No posterior fourchette tenderness, injury or lesion present.     No cervical friability, lesion, bleeding or polyp.  HENT:     Head: Normocephalic and atraumatic.  Eyes:     General: No scleral icterus.    Conjunctiva/sclera: Conjunctivae normal.  Neck:     Musculoskeletal: Normal range of motion and neck  supple.  Cardiovascular:     Rate and Rhythm: Normal rate and regular rhythm.     Heart sounds: No murmur. No friction rub. No gallop.   Pulmonary:     Effort: Pulmonary effort is normal. No respiratory distress.     Breath sounds: Normal breath sounds. No wheezing or rales.  Abdominal:     General: Bowel sounds are normal. There is no distension.     Palpations: Abdomen  is soft. There is no mass.     Tenderness: There is no abdominal tenderness. There is no guarding or rebound.  Musculoskeletal: Normal range of motion.  Neurological:     General: No focal deficit present.     Mental Status: She is alert and oriented to person, place, and time.     Cranial Nerves: No cranial nerve deficit.  Skin:    General: Skin is warm and dry.     Findings: No erythema.  Psychiatric:        Mood and Affect: Mood normal.        Behavior: Behavior normal.        Judgment: Judgment normal.     IUD Removal  Patient identified, informed consent performed, consent signed.  Patient was in the dorsal lithotomy position, normal external genitalia was noted.  A speculum was placed in the patient's vagina, normal discharge was noted, no lesions. The cervix was visualized, no lesions, no abnormal discharge.  The strings of the IUD were grasped and pulled using ring forceps. The IUD was removed in its entirety. Patient tolerated the procedure well.    Patient will use condoms for contraception in the short terms, and plans for pregnancy soon and she was told to avoid teratogens, take PNV and folic acid.  Routine preventative health maintenance measures emphasized.  Female chaperone present for pelvic portion of the physical exam  Assessment: 38 y.o. [phone] female here for  1. Class 2 obesity due to excess calories without serious comorbidity with body mass index (BMI) of 35.0 to 35.9 in adult   2. Encounter for IUD removal      Plan: Problem List Items Addressed This Visit    None    Visit Diagnoses     Class 2 obesity due to excess calories without serious comorbidity with body mass index (BMI) of 35.0 to 35.9 in adult    -  Primary   Relevant Medications   Diethylpropion HCl CR 75 MG TB24   Encounter for IUD removal         1) 1500 Calorie ADA Diet  2) Patient education given regarding appropriate lifestyle changes for weight loss including: regular physical activity, healthy coping strategies, caloric restriction and healthy eating patterns.  3) Patient will be started on weight loss medication. The risks and benefits and side effects of medication, such as Adipex (Phenteramine) ,  Tenuate (Diethylproprion), Belviq (lorcarsin), Contrave (buproprion/naltrexone), Qsymia (phentermine/topiramate), and Saxenda (liraglutide) is discussed. The pros and cons of suppressing appetite and boosting metabolism is discussed. Risks of tolerence and addiction is discussed for selected agents discussed. Use of medicine will ne short term, such as 3-4 months at a time followed by a period of time off of the medicine to avoid these risks and side effects for Adipex, Qsymia, and Tenuate discussed. Pt to call with any negative side effects and agrees to keep follow up appts.  4) Comorbidity Screening - hypothyroidism screening, diabetes, and hyperlipidemia screening offered  5) Encouraged weekly weight monitorig to track progress and sample 1 week food diary  6) Contraception - discussed that all weight loss drugs fall in to pregnancy category X, patient currently has reliable contraception in the form of abstinence for the first 6 weeks. Will need plan beyond this. Will revisit at 4 week follow up.   7) Follow up in 4 weeks to assess response  18 minutes spent in face to face discussion with > 50% spent in counseling,management, and coordination of care  of her Class 2 obesity without comorbidity.   Prentice Docker, MD 09/26/2018 11:47 AM

## 2018-09-26 ENCOUNTER — Encounter: Payer: Self-pay | Admitting: Obstetrics and Gynecology

## 2018-09-29 ENCOUNTER — Telehealth: Payer: Self-pay

## 2018-09-29 NOTE — Telephone Encounter (Signed)
Syd[facility] from [facility] called again and said medication did not come in as it was suppose to, pt is upset and is requesting to have it filled at [facility] [place]. Called pharmacy and I spoke with Elta Guadeloupe and gave Rx verbally. Pt aware and is requesting to keep pharmacy [facility] [place] as her preferred one. I have updated it in her chart as well.

## 2018-09-29 NOTE — Telephone Encounter (Signed)
[place] reports patient is looking for rx for  Diethylpropion HCl CR 75 MG TB24  They have checked their fax and electronic and can't find it. Requesting verbal. [phone]

## 2018-09-29 NOTE — Telephone Encounter (Signed)
Spoke w/pharmacy. Verbal given for rx that was sent on 09/25/2018

## 2018-10-23 ENCOUNTER — Ambulatory Visit: Payer: 59 | Admitting: Obstetrics and Gynecology

## 2018-11-04 ENCOUNTER — Ambulatory Visit: Payer: 59 | Admitting: Obstetrics and Gynecology

## 2018-11-09 DIAGNOSIS — F4323 Adjustment disorder with mixed anxiety and depressed mood: Secondary | ICD-10-CM | POA: Insufficient documentation

## 2018-11-26 ENCOUNTER — Other Ambulatory Visit: Payer: Self-pay

## 2018-11-26 ENCOUNTER — Telehealth: Payer: Self-pay | Admitting: Obstetrics and Gynecology

## 2018-11-26 ENCOUNTER — Encounter: Payer: Self-pay | Admitting: Obstetrics and Gynecology

## 2018-11-26 ENCOUNTER — Ambulatory Visit (INDEPENDENT_AMBULATORY_CARE_PROVIDER_SITE_OTHER): Payer: 59 | Admitting: Obstetrics and Gynecology

## 2018-11-26 VITALS — BP 128/78 | HR 84 | Ht 68.0 in | Wt 232.0 lb

## 2018-11-26 DIAGNOSIS — E6609 Other obesity due to excess calories: Secondary | ICD-10-CM

## 2018-11-26 DIAGNOSIS — Z1339 Encounter for screening examination for other mental health and behavioral disorders: Secondary | ICD-10-CM

## 2018-11-26 DIAGNOSIS — Z1331 Encounter for screening for depression: Secondary | ICD-10-CM

## 2018-11-26 DIAGNOSIS — Z30011 Encounter for initial prescription of contraceptive pills: Secondary | ICD-10-CM

## 2018-11-26 DIAGNOSIS — Z01419 Encounter for gynecological examination (general) (routine) without abnormal findings: Secondary | ICD-10-CM | POA: Diagnosis not present

## 2018-11-26 MED ORDER — DIETHYLPROPION HCL ER 75 MG PO TB24: 1.0000 | ORAL_TABLET | Freq: Every day | ORAL | 0 refills | Status: DC

## 2018-11-26 MED ORDER — JUNEL FE 24 1-20 MG-MCG(24) PO TABS: 1.0000 | ORAL_TABLET | Freq: Every day | ORAL | 4 refills | Status: DC

## 2018-11-26 NOTE — Telephone Encounter (Signed)
Patient is schedule today. Patient is requesting if she can be sooner than schedule appointment at 3:30. Patient is aware that we have no openings due to no cancellations. Patient requested me to ask Provider. Please advise

## 2018-11-26 NOTE — Telephone Encounter (Signed)
Patient aware.

## 2018-11-26 NOTE — Telephone Encounter (Signed)
Unfortunately, I don't see any other openings.Marland KitchenMarland Kitchen

## 2018-11-26 NOTE — Progress Notes (Signed)
Gynecology Annual Exam  PCP: [place]  Chief Complaint  Patient presents with  . Gynecologic Exam   History of Present Illness:  Ms. Catherine Montgomery is a 38 y.o. [id number] who LMP was 1-2 weeks ago, presents today for her annual examination.  Her menses have just returned from getting her IUD out in September this year.   She is sexually active.  Declines issues with intercourse.  Last Pap: 1 year ago  Results were: no abnormalities /neg HPV DNA negative Hx of STDs: trichomonas  There is a FH of breast cancer. She has been tested before for genetic mutations. There is no FH of ovarian cancer. The patient does not do self-breast exams.  Tobacco use: The patient denies current or previous tobacco use. Alcohol use: social drinker Exercise: moderately active  The patient wears seatbelts: yes.   The patient reports that domestic violence in her life is absent.   She also would like to restart her medication for weight loss.  She has not been taking the medication consistently.  She would like to give it another shot.  She has tolerated it well in the past.  She also would like to restart contraception.  She denies a history of DVT and PE, liver disease, hypertension, smoking.  Past Medical History:  Diagnosis Date  . Anemia   . Anxiety   . Bell's palsy    Lt side  . Environmental allergies   . Obesity (BMI 35.0-39.9 without comorbidity)   . Vulvar intraepithelial neoplasia (VIN) grade 3     Past Surgical History:  Procedure Laterality Date  . CESAREAN SECTION    . CESAREAN SECTION N/A 04/18/2015   Procedure: CESAREAN SECTION;  Surgeon: Will Bonnet, MD;  Location: [facility] ORS;  Service: Obstetrics;  Laterality: N/A;  . EYE SURGERY  in college  . VULVA /PERINEUM BIOPSY     VIN 3    Prior to Admission medications   Medication Sig Start Date End Date Taking? Authorizing Provider  cetirizine (ZYRTEC) 10 MG chewable tablet Chew 10 mg by mouth daily.   Yes [provider]  fluticasone (FLONASE) 50 MCG/ACT nasal spray Place 2 sprays into both nostrils daily.   Yes [provider]  ketoconazole (NIZORAL) 2 % shampoo USE FOR 1-2WEEKS ALLOW TO REMAIN ON DRY SCALP FOR 15MIN THEN [place] OUT FOLLOWED BY REG SHAMPOO 07/20/16  Yes [provider]  Multiple Vitamin (MULTIVITAMIN) tablet Take 1 tablet by mouth daily.   Yes [provider]  sertraline (ZOLOFT) 50 MG tablet Take 50 mg by mouth daily.   Yes [provider]  Diethylpropion HCl CR 75 MG TB24 Take 1 tablet (75 mg total) by mouth daily before breakfast. Patient not taking: Reported on 11/26/2018 09/25/18   Will Bonnet, MD  Glycopyrronium Tosylate 2.4 % PADS Apply topically.    [provider]   Allergies: No Known Allergies  Obstetric History: [id number]  Social History   Socioeconomic History  . Marital status: Married    Spouse name: Not on file  . Number of children: 2  . Years of education: Not on file  . Highest education level: Not on file  Occupational History  . Not on file  Social Needs  . Financial resource strain: Not on file  . Food insecurity    Worry: Not on file    Inability: Not on file  . Transportation needs    Medical: Not on file    Non-medical: Not  on file  Tobacco Use  . Smoking status: Never Smoker  . Smokeless tobacco: Never Used  Substance and Sexual Activity  . Alcohol use: No  . Drug use: No  . Sexual activity: Yes    Partners: Male    Birth control/protection: None    Comment: Mirena  Lifestyle  . Physical activity    Days per week: Not on file    Minutes per session: Not on file  . Stress: Not on file  Relationships  . Social Musician on phone: Not on file    Gets together: Not on file    Attends religious service: Not on file    Active member of club or organization: Not on file    Attends meetings of clubs or organizations: Not on file    Relationship status: Not on file  .  Intimate partner violence    Fear of current or ex partner: Not on file    Emotionally abused: Not on file    Physically abused: Not on file    Forced sexual activity: Not on file  Other Topics Concern  . Not on file  Social History Narrative  . Not on file    Family History  Problem Relation Age of Onset  . Hypertension Mother   . Diabetes Father   . Hypertension Father   . Diabetes Sister   . Diabetes Maternal Grandmother   . Diabetes Maternal Grandfather   . Breast cancer Paternal Grandmother        27's  . Breast cancer Paternal Uncle 54  . Breast cancer Maternal Aunt 45  . Colon cancer Paternal Uncle     Review of Systems  Constitutional: Negative.   HENT: Negative.   Eyes: Negative.   Respiratory: Negative.   Cardiovascular: Negative.   Gastrointestinal: Negative.   Genitourinary: Negative.   Musculoskeletal: Negative.   Skin: Negative.   Neurological: Negative.   Psychiatric/Behavioral: Negative.      Physical Exam BP 128/78 (BP Location: Left Arm, Patient Position: Sitting, Cuff Size: Large)   Pulse 84   Ht 5\' 8"  (1.727 m)   Wt 232 lb (105.2 kg)   BMI 35.28 kg/m   Physical Exam Constitutional:      General: She is not in acute distress.    Appearance: Normal appearance. She is well-developed.  Genitourinary:     Pelvic exam was performed with patient supine.     Vulva, urethra, bladder and uterus normal.     No inguinal adenopathy present in the right or left side.    No signs of injury in the vagina.     No vaginal discharge, erythema, tenderness or bleeding.     No cervical motion tenderness, discharge, lesion or polyp.     Uterus is mobile.     Uterus is not enlarged or tender.     No uterine mass detected.    Uterus is anteverted.     No right or left adnexal mass present.     Right adnexa not tender or full.     Left adnexa not tender or full.  HENT:     Head: Normocephalic and atraumatic.  Eyes:     General: No scleral icterus.     Conjunctiva/sclera: Conjunctivae normal.  Neck:     Musculoskeletal: Normal range of motion and neck supple.     Thyroid: No thyromegaly.  Cardiovascular:     Rate and Rhythm: Normal rate and regular rhythm.  Heart sounds: No murmur. No friction rub. No gallop.   Pulmonary:     Effort: Pulmonary effort is normal. No respiratory distress.     Breath sounds: Normal breath sounds. No wheezing or rales.  Chest:     Breasts:        Right: No inverted nipple, mass, nipple discharge, skin change or tenderness.        Left: No inverted nipple, mass, nipple discharge, skin change or tenderness.  Abdominal:     General: Bowel sounds are normal. There is no distension.     Palpations: Abdomen is soft. There is no mass.     Tenderness: There is no abdominal tenderness. There is no guarding or rebound.  Musculoskeletal: Normal range of motion.        General: No swelling or tenderness.  Lymphadenopathy:     Cervical: No cervical adenopathy.     Lower Body: No right inguinal adenopathy. No left inguinal adenopathy.  Neurological:     General: No focal deficit present.     Mental Status: She is alert and oriented to person, place, and time.     Cranial Nerves: No cranial nerve deficit.  Skin:    General: Skin is warm and dry.     Findings: No erythema or rash.  Psychiatric:        Mood and Affect: Mood normal.        Behavior: Behavior normal.        Judgment: Judgment normal.     Female chaperone present for pelvic and breast  portions of the physical exam  Results: AUDIT Questionnaire (screen for alcoholism): 1 PHQ-9: 9  Assessment: 38 y.o. [id number] female here for routine annual gynecologic examination  Plan: Problem List Items Addressed This Visit    None    Visit Diagnoses    Women's annual routine gynecological examination    -  Primary   Relevant Medications   Norethindrone Acetate-Ethinyl Estrad-FE (JUNEL FE 24) 1-20 MG-MCG(24) tablet   Screening for depression        Screening for alcoholism       Encounter for initial prescription of contraceptive pills       Relevant Medications   Norethindrone Acetate-Ethinyl Estrad-FE (JUNEL FE 24) 1-20 MG-MCG(24) tablet   Class 2 obesity due to excess calories without serious comorbidity with body mass index (BMI) of 35.0 to 35.9 in adult       Relevant Medications   Diethylpropion HCl CR 75 MG TB24      Screening: -- Blood pressure screen normal -- Weight screening: obese: discussed management options, including lifestyle, dietary, and exercise. Will start medication.  -- Depression screening negative (PHQ-9) -- Nutrition: normal -- cholesterol screening: not due for screening -- osteoporosis screening: not due -- tobacco screening: not using -- alcohol screening: AUDIT questionnaire indicates low-risk usage. -- family history of breast cancer screening: done. not at high risk. -- no evidence of domestic violence or intimate partner violence. -- STD screening: gonorrhea/chlamydia NAAT not collected per patient request. -- pap smear not collected per ASCCP guidelines -- flu vaccine received  Contraceptive management: started Junel 1/20.  She understands VTE risks.   Return in about 4 weeks (around 12/24/2018) for Medication follow up.   Thomasene MohairStephen Zondra Lawlor, MD 11/26/2018 5:02 PM

## 2018-12-02 ENCOUNTER — Telehealth: Payer: Self-pay

## 2018-12-02 NOTE — Telephone Encounter (Signed)
PA received from [facility] [address].  Tried to start PA thru CoverMyMeds but ins was not eligible.  Called pt to confirm number.  Pt states to ignore this PA.

## 2018-12-30 ENCOUNTER — Ambulatory Visit: Payer: 59 | Admitting: Obstetrics and Gynecology

## 2018-12-30 ENCOUNTER — Other Ambulatory Visit: Payer: Self-pay

## 2018-12-30 ENCOUNTER — Encounter: Payer: Self-pay | Admitting: Obstetrics and Gynecology

## 2018-12-30 NOTE — Progress Notes (Deleted)
  Virtual Visit via Telephone Note  I connected with Catherine Montgomery on 12/30/18 at  8:10 AM EST by telephone and verified that I am speaking with the correct person using two identifiers.   I discussed the limitations, risks, security and privacy concerns of performing an evaluation and management service by telephone and the availability of in person appointments. I also discussed with the patient that there may be a patient responsible charge related to this service. The patient expressed understanding and agreed to proceed.  The patient was at *** I spoke with the patient from my  *** The names of people involved in this encounter were: *** , and *** , and *** .   History of Present Illness: Patient is a 38 y.o. [phone] female, who presents for the evaluation of the desire to lose weight. She has lost *** pounds. The patient states the following symptoms since starting her weight loss therapy: appetite suppression, energy, and weight loss.  The patient also reports no other ill effects. The patient specifically denies heart palpitations, anxiety, and insomnia.     Observations/Objective: *** Physical Exam could not be performed. Because of the COVID-19 outbreak this visit was performed over the phone and not in person.   Assessment and Plan: 1) 1800 Calorie ADA Diet  2) Patient education given regarding appropriate lifestyle changes for weight loss including: regular physical activity, healthy coping strategies, caloric restriction and healthy eating patterns.  3) Patient will be started on weight loss medication. The risks and benefits and side effects of medication, such as Adipex (Phenteramine) ,  Tenuate (Diethylproprion), Belviq (lorcarsin), Contrave (buproprion/naltrexone), Qsymia (phentermine/topiramate), and Saxenda (liraglutide) is discussed. The pros and cons of suppressing appetite and boosting metabolism is discussed. Risks of tolerence and addiction is discussed for  selected agents discussed. Use of medicine will ne short term, such as 3-4 months at a time followed by a period of time off of the medicine to avoid these risks and side effects for Adipex, Qsymia, and Tenuate discussed. Pt to call with any negative side effects and agrees to keep follow up appts.  4) Patient to take medication, with the benefits of appetite suppression and metabolism boost d/w pt, along with the side effects and risk factors of long term use that will be avoided with our use of short bursts of therapy. Rx provided.    5) 15 minutes face-to-face; with counseling/coordination of care > 50 percent of visit related to obesity and ongoing management/treatment     Follow Up Instructions: Follow up in 4 weeks to assess response    I discussed the assessment and treatment plan with the patient. The patient was provided an opportunity to ask questions and all were answered. The patient agreed with the plan and demonstrated an understanding of the instructions.   The patient was advised to call back or seek an in-person evaluation if the symptoms worsen or if the condition fails to improve as anticipated.  I provided *** minutes of non-face-to-face time during this encounter.  Catherine Docker, MD  [facility], [facility] 12/30/2018 8:37 AM

## 2019-01-14 ENCOUNTER — Encounter: Payer: Self-pay | Admitting: Obstetrics and Gynecology

## 2019-01-14 ENCOUNTER — Other Ambulatory Visit: Payer: Self-pay

## 2019-01-14 ENCOUNTER — Ambulatory Visit (INDEPENDENT_AMBULATORY_CARE_PROVIDER_SITE_OTHER): Payer: 59 | Admitting: Obstetrics and Gynecology

## 2019-01-14 VITALS — Ht 68.0 in | Wt 227.0 lb

## 2019-01-14 DIAGNOSIS — E6609 Other obesity due to excess calories: Secondary | ICD-10-CM

## 2019-01-14 DIAGNOSIS — Z6835 Body mass index (BMI) 35.0-35.9, adult: Secondary | ICD-10-CM | POA: Diagnosis not present

## 2019-01-14 NOTE — Progress Notes (Signed)
  Virtual Visit via Telephone Note  I connected with Catherine Montgomery on 01/14/19 at  8:10 AM EST by telephone and verified that I am speaking with the correct person using two identifiers.   I discussed the limitations, risks, security and privacy concerns of performing an evaluation and management service by telephone and the availability of in person appointments. I also discussed with the patient that there may be a patient responsible charge related to this service. The patient expressed understanding and agreed to proceed.  The patient was at home I spoke with the patient from my  office The names of people involved in this encounter were: Catherine Montgomery and Catherine Mohair, MD.   History of Present Illness: Patient is a 39 y.o. [phone] female, who presents for the evaluation of the desire to lose weight. She has lost 5 pounds. The patient states the following symptoms since starting her weight loss therapy: appetite suppression, energy, and weight loss.  The patient also reports no other ill effects. She notes some throat feeling dry.  The patient specifically denies heart palpitations, anxiety, and insomnia.  She states that she has not been completely compliant with taking the medication.  She counts 24 of her 30 pills remaining from her November prescription.     Observations/Objective: Physical Exam could not be performed. Because of the COVID-19 outbreak this visit was performed over the phone and not in person.   Assessment and Plan: 39 y.o. [id number] female with class II obesity taking weight loss medication with some benefit.  Follow Up Instructions: She will continue to take the medication for now.  After completing her current course she will take a short break and see how she does off the medication.  She should call with any unusual side effects. No prescription given today as she still has the majority of the prescription she received in November.   I discussed the  assessment and treatment plan with the patient. The patient was provided an opportunity to ask questions and all were answered. The patient agreed with the plan and demonstrated an understanding of the instructions.   The patient was advised to call back or seek an in-person evaluation if the symptoms worsen or if the condition fails to improve as anticipated.  I provided 17:42 minutes of non-face-to-face time during this encounter.  Catherine Mohair, MD  [facility], [facility] 01/14/2019 8:17 AM

## 2019-01-29 ENCOUNTER — Ambulatory Visit (INDEPENDENT_AMBULATORY_CARE_PROVIDER_SITE_OTHER): Payer: 59 | Admitting: Certified Nurse Midwife

## 2019-01-29 ENCOUNTER — Other Ambulatory Visit: Payer: Self-pay

## 2019-01-29 ENCOUNTER — Encounter: Payer: Self-pay | Admitting: Certified Nurse Midwife

## 2019-01-29 VITALS — BP 130/80 | HR 84 | Ht 68.0 in | Wt 238.0 lb

## 2019-01-29 DIAGNOSIS — N61 Mastitis without abscess: Secondary | ICD-10-CM | POA: Diagnosis not present

## 2019-01-29 MED ORDER — SULFAMETHOXAZOLE-TRIMETHOPRIM 800-160 MG PO TABS: 1.0000 | ORAL_TABLET | Freq: Two times a day (BID) | ORAL | 0 refills | Status: DC

## 2019-01-29 NOTE — Progress Notes (Signed)
Obstetrics & Gynecology Office Visit   Chief Complaint:  Chief Complaint  Patient presents with  . 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 She fist noticed a tender, red,  bump on her nipple at 11 o'clock about 1 week ago. The pain and swelling got worse 3 days ago, and the area just started draining in the office before I walked into the exam room. Denies fever. 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 Anemia   . Anxiety   . Bell's palsy    Lt side  . Environmental allergies   . Obesity (BMI 35.0-39.9 without comorbidity)   . Vulvar intraepithelial neoplasia (VIN) grade 3     Past Surgical History:  Past Surgical History:  Procedure Laterality Date  . CESAREAN SECTION    . CESAREAN SECTION N/A 04/18/2015   Procedure: CESAREAN SECTION;  Surgeon: Will Bonnet, MD;  Location: [facility] ORS;  Service: Obstetrics;  Laterality: N/A;  . EYE SURGERY  in college  . VULVA /PERINEUM BIOPSY     VIN 3    Gynecologic History: Patient's last menstrual period was 01/20/2019 (exact date).  Obstetric History: [id number]  Family History:  Family History  Problem Relation Age of Onset  . Hypertension Mother   . Diabetes Father   . Hypertension Father   . Diabetes Sister        not anymore  . Diabetes Maternal Grandmother   . Diabetes Maternal Grandfather   . Breast cancer Paternal Grandmother        34's  . Breast cancer Paternal Uncle 89  . Colon cancer Paternal Uncle   . Breast cancer Maternal Aunt 59    Social History:  Social History   Socioeconomic History  . Marital status:  Married    Spouse name: Not on file  . Number of children: 2  . Years of education: Not on file  . Highest education level: Not on file  Occupational History  . Not on file  Tobacco Use  . Smoking status: Never Smoker  . Smokeless tobacco: Never Used  Substance and Sexual Activity  . Alcohol use: No  . Drug use: No  . Sexual activity: Yes    Partners: Male    Birth control/protection: Pill  Other Topics Concern  . Not on file  Social History Narrative  . Not on file   Social Determinants of Health   Financial Resource Strain:   . Difficulty of Paying Living Expenses: Not on file  Food Insecurity:   . Worried About Charity fundraiser in the Last Year: Not on file  . Ran Out of Food in the Last Year: Not on file  Transportation Needs:   . Lack of Transportation (Medical): Not on file  . Lack of Transportation (Non-Medical): Not on file  Physical Activity:   . Days of Exercise per Week: Not on file  . Minutes of Exercise per Session: Not on file  Stress:   . Feeling of Stress : Not on file  Social Connections:   . Frequency  of Communication with Friends and Family: Not on file  . Frequency of Social Gatherings with Friends and Family: Not on file  . Attends Religious Services: Not on file  . Active Member of Clubs or Organizations: Not on file  . Attends Banker Meetings: Not on file  . Marital Status: Not on file  Intimate Partner Violence:   . Fear of Current or Ex-Partner: Not on file  . Emotionally Abused: Not on file  . Physically Abused: Not on file  . Sexually Abused: Not on file    Allergies:  No Known Allergies  Medications: Prior to Admission medications   Medication Sig Start Date End Date Taking? Authorizing Provider  cetirizine (ZYRTEC) 10 MG chewable tablet Chew 10 mg by mouth daily.   Yes [provider]  Diethylpropion HCl CR 75 MG TB24 Take 1 tablet (75 mg total) by mouth daily before breakfast. 11/26/18  Yes Conard Novak, MD  Fluocinolone Acetonide Body 0.01 % OIL Apply topically. 06/27/18  Yes [provider]  fluticasone (FLONASE) 50 MCG/ACT nasal spray Place 2 sprays into both nostrils daily.   Yes [provider]  Glycopyrronium Tosylate 2.4 % PADS Apply topically.   Yes [provider]  ketoconazole (NIZORAL) 2 % shampoo USE FOR 1-2WEEKS ALLOW TO REMAIN ON DRY SCALP FOR THEN WASH OUT FOLLOWED BY REG SHAMPOO 07/20/16  Yes [provider]  metFORMIN (GLUCOPHAGE) 500 MG tablet  01/12/19  Yes [provider]  Multiple Vitamin (MULTIVITAMIN) tablet Take 1 tablet by mouth daily.   Yes [provider]  Norethin Ace-Eth Estrad-FE (CHARLOTTE 24 FE) 1-20 MG-MCG(24) CHEW Chew by mouth. 12/03/18  Yes [provider]  sertraline (ZOLOFT) 50 MG tablet Take 50 mg by mouth daily.   Yes [provider]   Physical Exam Vitals: BP 130/80   Pulse 84   Ht 5\' 8"  (1.727 m)   Wt 238 lb (108 kg)   LMP 01/20/2019 (Exact Date)   BMI 36.19 kg/m    Patient's last menstrual period was 01/20/2019 (exact date).  Physical Exam  Constitutional: She is oriented to person, place, and time. She appears well-developed and well-nourished. No distress.  Respiratory:  Left breast: yellowish drainage from tender inflamed area at 11 o'clock on left nipple. Culture taken  Neurological: She is alert and oriented to person, place, and time.  Psychiatric: She has a normal mood and affect.     Assessment: 39 y.o. 30 left nipple infection  Plan: Aerobic culture sent Bactrim DS -one BID x 10 days Warm soaks BID to left nipple FU in 10-14 days  10-02-1977, CNM

## 2019-01-31 LAB — AEROBIC CULTURE

## 2019-02-18 ENCOUNTER — Ambulatory Visit: Payer: 59 | Admitting: Certified Nurse Midwife

## 2019-04-15 ENCOUNTER — Ambulatory Visit: Payer: 59 | Admitting: Obstetrics and Gynecology

## 2019-05-10 DIAGNOSIS — R7303 Prediabetes: Secondary | ICD-10-CM | POA: Insufficient documentation

## 2019-07-17 ENCOUNTER — Other Ambulatory Visit: Payer: Self-pay

## 2019-07-17 ENCOUNTER — Ambulatory Visit (HOSPITAL_COMMUNITY)
Admission: EM | Admit: 2019-07-17 | Discharge: 2019-07-17 | Disposition: A | Discharge status: Home / Self Care | Payer: 59 | Attending: Emergency Medicine | Admitting: Emergency Medicine

## 2019-07-17 ENCOUNTER — Encounter (HOSPITAL_COMMUNITY): Payer: Self-pay

## 2019-07-17 DIAGNOSIS — T23222A Burn of second degree of single left finger (nail) except thumb, initial encounter: Secondary | ICD-10-CM

## 2019-07-17 MED ORDER — SILVER SULFADIAZINE 1 % EX CREA: 1.0000 "application " | TOPICAL_CREAM | Freq: Every day | CUTANEOUS | 0 refills | Status: DC

## 2019-07-17 MED ORDER — IBUPROFEN 600 MG PO TABS: 600.0000 mg | ORAL_TABLET | Freq: Three times a day (TID) | ORAL | 0 refills | Status: DC | PRN

## 2019-07-17 NOTE — Discharge Instructions (Signed)
Please use Tylenol 500-1000 mg every 4-6 hours, may supplement with Ibuprofen 600 mg every 8 hours °May apply aloe vera for further relief/healing °May apply wet gauze to keep cool °Do not apply ice ° °For any further burns run cool water over burn for 20 min immediately after ° °

## 2019-07-17 NOTE — ED Triage Notes (Signed)
Pt present left index finger burn from cooking, she thought her stove was turned off and accident touch the grill and burn her finger

## 2019-07-18 NOTE — ED Provider Notes (Signed)
[facility]    CSN: [id number] Arrival date & time: 07/17/19  1719      History   Chief Complaint Chief Complaint  Patient presents with   Burn    left index pointer    HPI Catherine Montgomery is a 39 y.o. female presenting today for evaluation of a burn.  Patient reports that she burned her left index finger earlier today on accident.  Since she has had a lot of pain and stinging almost her fingers soaking in cool water/ice.  She denies difficulty bending finger.  She has taken some ibuprofen.  HPI  Past Medical History:  Diagnosis Date   Anemia    Anxiety    Bell's palsy    Lt side   Environmental allergies    Obesity (BMI 35.0-39.9 without comorbidity)    Vulvar intraepithelial neoplasia (VIN) grade 3     Patient Active Problem List   Diagnosis Date Noted   At high risk for breast cancer 01/29/2016   Status post cesarean section 04/18/2015    Past Surgical History:  Procedure Laterality Date   CESAREAN SECTION     CESAREAN SECTION N/A 04/18/2015   Procedure: CESAREAN SECTION;  Surgeon: Conard Novak, MD;  Location: [facility] ORS;  Service: Obstetrics;  Laterality: N/A;   EYE SURGERY  in college   VULVA /PERINEUM BIOPSY     VIN 3    OB History    Gravida  4   Para  2   Term  2   Preterm      AB  2   Living  2     SAB      TAB  2   Ectopic      Multiple  0   Live Births  2            Home Medications    Prior to Admission medications   Medication Sig Start Date End Date Taking? Authorizing Provider  cetirizine (ZYRTEC) 10 MG chewable tablet Chew 10 mg by mouth daily.    [provider]  Diethylpropion HCl CR 75 MG TB24 Take 1 tablet (75 mg total) by mouth daily before breakfast. 11/26/18   Conard Novak, MD  Fluocinolone Acetonide Body 0.01 % OIL Apply topically. 06/27/18   [provider]  fluticasone (FLONASE) 50 MCG/ACT nasal spray Place 2 sprays into both nostrils daily.    [provider]  Glycopyrronium Tosylate 2.4 % PADS Apply topically.    [provider]  ibuprofen (ADVIL) 600 MG tablet Take 1 tablet (600 mg total) by mouth every 8 (eight) hours as needed. 07/17/19   Cesar Alf C, PA-C  ketoconazole (NIZORAL) 2 % shampoo USE FOR 1-2WEEKS ALLOW TO REMAIN ON DRY SCALP FOR THEN WASH OUT FOLLOWED BY REG SHAMPOO 07/20/16   [provider]  metFORMIN (GLUCOPHAGE) 500 MG tablet  01/12/19   [provider]  Multiple Vitamin (MULTIVITAMIN) tablet Take 1 tablet by mouth daily.    [provider]  Norethin Ace-Eth Estrad-FE (CHARLOTTE 24 FE) 1-20 MG-MCG(24) CHEW Chew by mouth. 12/03/18   [provider]  sertraline (ZOLOFT) 50 MG tablet Take 50 mg by mouth daily.    [provider]  silver sulfADIAZINE (SILVADENE) 1 % cream Apply 1 application topically daily. 07/17/19   Dionicia Cerritos C, PA-C  sulfamethoxazole-trimethoprim (BACTRIM DS) 800-160 MG tablet Take 1 tablet by mouth 2 (two) times daily. 01/29/19   Farrel Conners, CNM  Family History Family History  Problem Relation Age of Onset   Hypertension Mother    Diabetes Father    Hypertension Father    Diabetes Sister        not anymore   Diabetes Maternal Grandmother    Diabetes Maternal Grandfather    Breast cancer Paternal Grandmother        48's   Breast cancer Paternal Uncle 22   Colon cancer Paternal Uncle    Breast cancer Maternal Aunt 23    Social History Social History   Tobacco Use   Smoking status: Never Smoker   Smokeless tobacco: Never Used  Building services engineer Use: Never used  Substance Use Topics   Alcohol use: No   Drug use: No     Allergies   Patient has no known allergies.   Review of Systems Review of Systems  Constitutional: Negative for fatigue and fever.  Eyes: Negative for visual disturbance.  Respiratory: Negative for shortness of breath.   Cardiovascular: Negative for chest pain.    Gastrointestinal: Negative for abdominal pain, nausea and vomiting.  Musculoskeletal: Positive for arthralgias. Negative for joint swelling.  Skin: Positive for color change. Negative for rash and wound.  Neurological: Negative for dizziness, weakness, light-headedness and headaches.     Physical Exam Triage Vital Signs ED Triage Vitals [07/17/19 1751]  Enc Vitals Group     BP 122/80     Pulse Rate 82     Resp 18     Temp 98.4 F (36.9 C)     Temp Source Oral     SpO2 99 %     Weight      Height      Head Circumference      Peak Flow      Pain Score 5     Pain Loc      Pain Edu?      Excl. in GC?    No data found.  Updated Vital Signs BP 122/80 (BP Location: Right Arm)    Pulse 82    Temp 98.4 F (36.9 C) (Oral)    Resp 18    SpO2 99%   Visual Acuity Right Eye Distance:   Left Eye Distance:   Bilateral Distance:    Right Eye Near:   Left Eye Near:    Bilateral Near:     Physical Exam Vitals and nursing note reviewed.  Constitutional:      Appearance: She is well-developed.     Comments: No acute distress  HENT:     Head: Normocephalic and atraumatic.     Nose: Nose normal.  Eyes:     Conjunctiva/sclera: Conjunctivae normal.  Cardiovascular:     Rate and Rhythm: Normal rate.  Pulmonary:     Effort: Pulmonary effort is normal. No respiratory distress.  Abdominal:     General: There is no distension.  Musculoskeletal:        General: Normal range of motion.     Cervical back: Neck supple.     Comments: Full active range of motion of left index finger at PIP and DIP  Skin:    General: Skin is warm and dry.     Comments: Distal left index finger with slight white discoloration noted to finger pad, no obvious blistering or bulla at this time   Neurological:     Mental Status: She is alert and oriented to person, place, and time.      UC Treatments / Results  Labs (all labs ordered are listed, but only abnormal results are displayed) Labs Reviewed  - No data to display  EKG   Radiology No results found.  Procedures Procedures (including critical care time)  Medications Ordered in UC Medications - No data to display  Initial Impression / Assessment and Plan / UC Course  I have reviewed the triage vital signs and the nursing notes.  Pertinent labs & imaging results that were available during my care of the patient were reviewed by me and considered in my medical decision making (see chart for details).     Finger appears to be partial-thickness burn, may develop into bulla/blister on distal finger pad.  Discussed wound care and monitoring.  No hindering of bending finger at this time.Discussed strict return precautions. Patient verbalized understanding and is agreeable with plan.  Final Clinical Impressions(s) / UC Diagnoses   Final diagnoses:  Partial thickness burn of finger of left hand, initial encounter     Discharge Instructions     Please use Tylenol [phone] mg every 4-6 hours, may supplement with Ibuprofen 600 mg every 8 hours May apply aloe vera for further relief/healing May apply wet gauze to keep cool Do not apply ice  For any further burns run cool water over burn for 20 min immediately after   ED Prescriptions    Medication Sig Dispense Auth. Provider   silver sulfADIAZINE (SILVADENE) 1 % cream  (Status: Discontinued) Apply 1 application topically daily. 50 g Dondra Rhett C, PA-C   silver sulfADIAZINE (SILVADENE) 1 % cream Apply 1 application topically daily. 50 g Omri Bertran C, PA-C   ibuprofen (ADVIL) 600 MG tablet Take 1 tablet (600 mg total) by mouth every 8 (eight) hours as needed. 30 tablet Katalyna Socarras, [place] C, PA-C     PDMP not reviewed this encounter.   Lew Dawes, PA-C 07/18/19 1329

## 2019-08-26 ENCOUNTER — Ambulatory Visit: Payer: 59 | Admitting: Obstetrics and Gynecology

## 2019-08-26 NOTE — Progress Notes (Deleted)
[facility]   No chief complaint on file.   HPI:      Ms. Catherine Montgomery is a 39 y.o. [id number] whose LMP was No LMP recorded., presents today for breast exam   Past Medical History:  Diagnosis Date  . Anemia   . Anxiety   . Bell's palsy    Lt side  . Environmental allergies   . Obesity (BMI 35.0-39.9 without comorbidity)   . Vulvar intraepithelial neoplasia (VIN) grade 3     Past Surgical History:  Procedure Laterality Date  . CESAREAN SECTION    . CESAREAN SECTION N/A 04/18/2015   Procedure: CESAREAN SECTION;  Surgeon: Conard Novak, MD;  Location: [facility] ORS;  Service: Obstetrics;  Laterality: N/A;  . EYE SURGERY  in college  . VULVA /PERINEUM BIOPSY     VIN 3    Family History  Problem Relation Age of Onset  . Hypertension Mother   . Diabetes Father   . Hypertension Father   . Diabetes Sister        not anymore  . Diabetes Maternal Grandmother   . Diabetes Maternal Grandfather   . Breast cancer Paternal Grandmother        66's  . Breast cancer Paternal Uncle 31  . Colon cancer Paternal Uncle   . Breast cancer Maternal Aunt 82    Social History   Socioeconomic History  . Marital status: Married    Spouse name: Not on file  . Number of children: 2  . Years of education: Not on file  . Highest education level: Not on file  Occupational History  . Not on file  Tobacco Use  . Smoking status: Never Smoker  . Smokeless tobacco: Never Used  Vaping Use  . Vaping Use: Never used  Substance and Sexual Activity  . Alcohol use: No  . Drug use: No  . Sexual activity: Yes    Partners: Male    Birth control/protection: Pill  Other Topics Concern  . Not on file  Social History Narrative  . Not on file   Social Determinants of Health   Financial Resource Strain:   . Difficulty of Paying Living Expenses: Not on file  Food Insecurity:   . Worried About Programme researcher, broadcasting/film/video in the Last Year: Not on file  . Ran Out of Food in the Last Year:  Not on file  Transportation Needs:   . Lack of Transportation (Medical): Not on file  . Lack of Transportation (Non-Medical): Not on file  Physical Activity:   . Days of Exercise per Week: Not on file  . Minutes of Exercise per Session: Not on file  Stress:   . Feeling of Stress : Not on file  Social Connections:   . Frequency of Communication with Friends and Family: Not on file  . Frequency of Social Gatherings with Friends and Family: Not on file  . Attends Religious Services: Not on file  . Active Member of Clubs or Organizations: Not on file  . Attends Banker Meetings: Not on file  . Marital Status: Not on file  Intimate Partner Violence:   . Fear of Current or Ex-Partner: Not on file  . Emotionally Abused: Not on file  . Physically Abused: Not on file  . Sexually Abused: Not on file    Outpatient Medications Prior to Visit  Medication Sig Dispense Refill  . cetirizine (ZYRTEC) 10 MG chewable tablet Chew 10 mg by mouth daily.    Marland Kitchen  Diethylpropion HCl CR 75 MG TB24 Take 1 tablet (75 mg total) by mouth daily before breakfast. 30 tablet 0  . Fluocinolone Acetonide Body 0.01 % OIL Apply topically.    . fluticasone (FLONASE) 50 MCG/ACT nasal spray Place 2 sprays into both nostrils daily.    . Glycopyrronium Tosylate 2.4 % PADS Apply topically.    Marland Kitchen ibuprofen (ADVIL) 600 MG tablet Take 1 tablet (600 mg total) by mouth every 8 (eight) hours as needed. 30 tablet 0  . ketoconazole (NIZORAL) 2 % shampoo USE FOR 1-2WEEKS ALLOW TO REMAIN ON DRY SCALP FOR THEN WASH OUT FOLLOWED BY REG SHAMPOO  1  . metFORMIN (GLUCOPHAGE) 500 MG tablet     . Multiple Vitamin (MULTIVITAMIN) tablet Take 1 tablet by mouth daily.    . Norethin Ace-Eth Estrad-FE (CHARLOTTE 24 FE) 1-20 MG-MCG(24) CHEW Chew by mouth.    . sertraline (ZOLOFT) 50 MG tablet Take 50 mg by mouth daily.    . silver sulfADIAZINE (SILVADENE) 1 % cream Apply 1 application topically daily. 50 g 0  .  sulfamethoxazole-trimethoprim (BACTRIM DS) 800-160 MG tablet Take 1 tablet by mouth 2 (two) times daily. 20 tablet 0   No facility-administered medications prior to visit.      ROS:  Review of Systems BREAST: No symptoms   OBJECTIVE:   Vitals:  There were no vitals taken for this visit.  Physical Exam  Results: No results found for this or any previous visit (from the past 24 hour(s)).   Assessment/Plan: No diagnosis found.    No orders of the defined types were placed in this encounter.     No follow-ups on file.  Silena Wyss B. Kyriakos Babler, PA-C 08/26/2019 11:42 AM

## 2019-08-30 ENCOUNTER — Ambulatory Visit: Payer: 59 | Admitting: Obstetrics & Gynecology

## 2020-03-07 ENCOUNTER — Other Ambulatory Visit: Payer: Self-pay

## 2020-03-07 ENCOUNTER — Ambulatory Visit (INDEPENDENT_AMBULATORY_CARE_PROVIDER_SITE_OTHER): Payer: BC Managed Care – PPO | Admitting: Obstetrics and Gynecology

## 2020-03-07 ENCOUNTER — Encounter: Payer: Self-pay | Admitting: Obstetrics and Gynecology

## 2020-03-07 VITALS — BP 126/80 | Ht 68.0 in | Wt 235.0 lb

## 2020-03-07 DIAGNOSIS — Z01419 Encounter for gynecological examination (general) (routine) without abnormal findings: Secondary | ICD-10-CM | POA: Diagnosis not present

## 2020-03-07 DIAGNOSIS — Z1331 Encounter for screening for depression: Secondary | ICD-10-CM

## 2020-03-07 DIAGNOSIS — Z1339 Encounter for screening examination for other mental health and behavioral disorders: Secondary | ICD-10-CM

## 2020-03-07 NOTE — Progress Notes (Signed)
Gynecology Annual Exam  PCP: [facility]  Chief Complaint  Patient presents with  . Annual Exam   History of Present Illness:  Ms. Catherine Montgomery is a 40 y.o. [id number] who LMP was Patient's last menstrual period was 02/26/2020., presents today for her annual examination.  Her menses are regular every 28-30 days, lasting 5 day(s).  Dysmenorrhea mild, occurring first 1-2 days of flow. She does not have intermenstrual bleeding.  She is sexually active. No issues with intercourse.  No contraception. .  Last Pap: 11/2016  Results were: no abnormalities /neg HPV DNA negative Hx of STDs: trichomonas  There is no FH of breast cancer. There is no FH of ovarian cancer. The patient does do self-breast exams.  Tobacco use: The patient denies current or previous tobacco use. Alcohol use: social drinker Exercise: walking  The patient wears seatbelts: yes.   The patient reports that domestic violence in her life is absent.   Past Medical History:  Diagnosis Date  . Anemia   . Anxiety   . Bell's palsy    Lt side  . Environmental allergies   . Obesity (BMI 35.0-39.9 without comorbidity)   . Vulvar intraepithelial neoplasia (VIN) grade 3     Past Surgical History:  Procedure Laterality Date  . CESAREAN SECTION    . CESAREAN SECTION N/A 04/18/2015   Procedure: CESAREAN SECTION;  Surgeon: Conard Novak, MD;  Location: [facility] ORS;  Service: Obstetrics;  Laterality: N/A;  . EYE SURGERY  in college  . VULVA /PERINEUM BIOPSY     VIN 3    Prior to Admission medications   Medication Sig Start Date End Date Taking? Authorizing Provider  cetirizine (ZYRTEC) 10 MG chewable tablet Chew 10 mg by mouth daily.   Yes [provider]  fluticasone (FLONASE) 50 MCG/ACT nasal spray Place 2 sprays into both nostrils daily.   Yes [provider]  ketoconazole (NIZORAL) 2 % shampoo USE FOR 1-2WEEKS ALLOW TO REMAIN ON DRY SCALP FOR THEN WASH OUT FOLLOWED BY REG SHAMPOO 07/20/16   Yes [provider]  sertraline (ZOLOFT) 50 MG tablet Take 50 mg by mouth daily.   Yes [provider]  metFORMIN (GLUCOPHAGE) 500 MG tablet  01/12/19   [provider]   Allergies: No Known Allergies  Obstetric History: [id number]  Social History   Socioeconomic History  . Marital status: Married    Spouse name: Not on file  . Number of children: 2  . Years of education: Not on file  . Highest education level: Not on file  Occupational History  . Not on file  Tobacco Use  . Smoking status: Never Smoker  . Smokeless tobacco: Never Used  Vaping Use  . Vaping Use: Never used  Substance and Sexual Activity  . Alcohol use: No  . Drug use: No  . Sexual activity: Yes    Partners: Male    Birth control/protection: Pill  Other Topics Concern  . Not on file  Social History Narrative  . Not on file   Social Determinants of Health   Financial Resource Strain: Not on file  Food Insecurity: Not on file  Transportation Needs: Not on file  Physical Activity: Not on file  Stress: Not on file  Social Connections: Not on file  Intimate Partner Violence: Not on file    Family History  Problem Relation Age of Onset  . Hypertension Mother   . Diabetes Father   . Hypertension Father   .  Diabetes Sister        not anymore  . Diabetes Maternal Grandmother   . Diabetes Maternal Grandfather   . Breast cancer Paternal Grandmother        61's  . Breast cancer Paternal Uncle 61  . Colon cancer Paternal Uncle   . Breast cancer Maternal Aunt 45    Review of Systems  Constitutional: Negative.   HENT: Negative.   Eyes: Negative.   Respiratory: Negative.   Cardiovascular: Negative.   Gastrointestinal: Negative.   Genitourinary: Negative.   Musculoskeletal: Negative.   Skin: Negative.   Neurological: Negative.   Psychiatric/Behavioral: Negative.      Physical Exam BP 126/80   Ht 5\' 8"  (1.727 m)   Wt 235 lb (106.6 kg)   LMP 02/26/2020   BMI 35.73  kg/m    Physical Exam Constitutional:      General: She is not in acute distress.    Appearance: Normal appearance. She is well-developed.  Genitourinary:     Vulva and bladder normal.     Right Labia: No rash, tenderness, lesions, skin changes or Bartholin's cyst.    Left Labia: No tenderness, lesions, skin changes, Bartholin's cyst or rash.    No inguinal adenopathy present in the right or left side.    Pelvic Tanner Score: 5/5.    No vaginal discharge, erythema, tenderness or bleeding.      Right Adnexa: not tender, not full and no mass present.    Left Adnexa: not tender, not full and no mass present.    No cervical motion tenderness, discharge, lesion or polyp.     Uterus is not enlarged or tender.     No uterine mass detected.    Pelvic exam was performed with patient in the lithotomy position.  Breasts:     Right: No inverted nipple, mass, nipple discharge, skin change or tenderness.     Left: No inverted nipple, mass, nipple discharge, skin change or tenderness.    HENT:     Head: Normocephalic and atraumatic.  Eyes:     General: No scleral icterus.    Conjunctiva/sclera: Conjunctivae normal.  Neck:     Thyroid: No thyromegaly.  Cardiovascular:     Rate and Rhythm: Normal rate and regular rhythm.     Heart sounds: No murmur heard. No friction rub. No gallop.   Pulmonary:     Effort: Pulmonary effort is normal. No respiratory distress.     Breath sounds: Normal breath sounds. No wheezing or rales.  Abdominal:     General: Bowel sounds are normal. There is no distension.     Palpations: Abdomen is soft. There is no mass.     Tenderness: There is no abdominal tenderness. There is no guarding or rebound.     Hernia: There is no hernia in the left inguinal area or right inguinal area.  Musculoskeletal:        General: No swelling or tenderness. Normal range of motion.     Cervical back: Normal range of motion and neck supple.  Lymphadenopathy:     Cervical: No  cervical adenopathy.     Lower Body: No right inguinal adenopathy. No left inguinal adenopathy.  Neurological:     General: No focal deficit present.     Mental Status: She is alert and oriented to person, place, and time.     Cranial Nerves: No cranial nerve deficit.  Skin:    General: Skin is warm and dry.  Findings: No erythema or rash.  Psychiatric:        Mood and Affect: Mood normal.        Behavior: Behavior normal.        Judgment: Judgment normal.    Female chaperone present for pelvic and breast  portions of the physical exam  Results: AUDIT Questionnaire (screen for alcoholism): 2 PHQ-9: 11   Assessment: 40 y.o. [id number] female here for routine annual gynecologic examination  Plan: Problem List Items Addressed This Visit   None   Visit Diagnoses    Women's annual routine gynecological examination    -  Primary   Screening for depression       Screening for alcoholism          Screening: -- Blood pressure screen normal -- Weight screening: obese: discussed management options, including lifestyle, dietary, and exercise. -- Depression screening negative (PHQ-9) -- Nutrition: normal -- cholesterol screening: not due for screening -- osteoporosis screening: not due -- tobacco screening: not using -- alcohol screening: AUDIT questionnaire indicates low-risk usage. -- family history of breast cancer screening: done. not at high risk. -- no evidence of domestic violence or intimate partner violence. -- STD screening: gonorrhea/chlamydia NAAT not collected per patient request. -- pap smear not collected per ASCCP guidelines -- flu vaccine received   Interested in pregnancy. She would like to lose 40 lbs.  She will continue to consider and decide.   Thomasene Mohair, MD 03/07/2020 9:09 AM

## 2020-05-10 ENCOUNTER — Encounter: Payer: Self-pay | Admitting: Nurse Practitioner

## 2020-05-10 ENCOUNTER — Other Ambulatory Visit: Payer: Self-pay

## 2020-05-10 ENCOUNTER — Ambulatory Visit (INDEPENDENT_AMBULATORY_CARE_PROVIDER_SITE_OTHER): Payer: BC Managed Care – PPO | Admitting: Nurse Practitioner

## 2020-05-10 VITALS — BP 116/78 | HR 92 | Temp 97.5°F | Ht 68.0 in | Wt 220.0 lb

## 2020-05-10 DIAGNOSIS — H6123 Impacted cerumen, bilateral: Secondary | ICD-10-CM | POA: Diagnosis not present

## 2020-05-10 NOTE — Patient Instructions (Signed)

## 2020-05-10 NOTE — Progress Notes (Signed)
I,Yamilka Roman [employer] as a Neurosurgeon for [employer], FNP.,have documented all relevant documentation on the behalf of Arnette Felts, FNP,as directed by  Arnette Felts, FNP while in the presence of Arnette Felts, FNP. This visit occurred during the SARS-CoV-2 public health emergency.  Safety protocols were in place, including screening questions prior to the visit, additional usage of staff PPE, and extensive cleaning of exam room while observing appropriate contact time as indicated for disinfecting solutions.  Subjective:     Patient ID: Catherine Montgomery , female    DOB: 02-Apr-1980 , 40 y.o.   MRN: [id number]   Chief Complaint  Patient presents with  . Cerumen Impaction    Patient stated she feels like her right ear is clogged she did admit to using a bobby pin to get the wax out but she feels it may have pushed some in.     HPI  She is here due to feeling like right ear is clogged. Denies any pain. She does admit to putting a bobby pin. She has noticed she is talking louder.     Past Medical History:  Diagnosis Date  . Anemia   . Anxiety   . Bell's palsy    Lt side  . Environmental allergies   . Obesity (BMI 35.0-39.9 without comorbidity)   . Vulvar intraepithelial neoplasia (VIN) grade 3      Family History  Problem Relation Age of Onset  . Hypertension Mother   . Diabetes Father   . Hypertension Father   . Diabetes Sister        not anymore  . Diabetes Maternal Grandmother   . Diabetes Maternal Grandfather   . Breast cancer Paternal Grandmother        65's  . Breast cancer Paternal Uncle 38  . Colon cancer Paternal Uncle   . Breast cancer Maternal Aunt 45     Current Outpatient Medications:  .  cetirizine (ZYRTEC) 10 MG chewable tablet, Chew 10 mg by mouth daily., Disp: , Rfl:  .  fluticasone (FLONASE) 50 MCG/ACT nasal spray, Place 2 sprays into both nostrils daily., Disp: , Rfl:  .  Glycopyrronium Tosylate 2.4 % PADS, Apply topically., Disp: , Rfl:  .   ketoconazole (NIZORAL) 2 % shampoo, USE FOR 1-2WEEKS ALLOW TO REMAIN ON DRY SCALP FOR THEN WASH OUT FOLLOWED BY REG SHAMPOO, Disp: , Rfl: 1 .  Multiple Vitamin (MULTIVITAMIN) tablet, Take 1 tablet by mouth daily., Disp: , Rfl:  .  sertraline (ZOLOFT) 50 MG tablet, Take 50 mg by mouth daily., Disp: , Rfl:    No Known Allergies   Review of Systems  Constitutional: Negative.   HENT: Negative for ear discharge and ear pain.        Feels like she is talking louder than usual  Respiratory: Negative.   Cardiovascular: Negative.   Neurological: Negative for dizziness and headaches.  Psychiatric/Behavioral: Negative.      Today's Vitals   05/10/20 1450  BP: 116/78  Pulse: 92  Temp: (!) 97.5 F (36.4 C)  TempSrc: Oral  Weight: 220 lb (99.8 kg)  Height: 5\' 8"  (1.727 m)  PainSc: 0-No pain   Body mass index is 33.45 kg/m.   Objective:  Physical Exam Constitutional:      General: She is not in acute distress.    Appearance: Normal appearance. She is obese.  HENT:     Right Ear: External ear normal. There is impacted cerumen.     Left Ear: External ear  normal. There is impacted cerumen.  Cardiovascular:     Rate and Rhythm: Normal rate and regular rhythm.     Pulses: Normal pulses.     Heart sounds: Normal heart sounds. No murmur heard.   Pulmonary:     Effort: Pulmonary effort is normal. No respiratory distress.     Breath sounds: Normal breath sounds. No wheezing.  Skin:    Capillary Refill: Capillary refill takes less than 2 seconds.  Neurological:     General: No focal deficit present.     Mental Status: She is alert and oriented to person, place, and time.     Cranial Nerves: No cranial nerve deficit.  Psychiatric:        Mood and Affect: Mood normal.        Behavior: Behavior normal.        Thought Content: Thought content normal.        Judgment: Judgment normal.         Assessment And Plan:     1. Bilateral impacted cerumen  Water lavage done with good  results and could hear better - Ear Lavage      Patient was given opportunity to ask questions. Patient verbalized understanding of the plan and was able to repeat key elements of the plan. All questions were answered to their satisfaction.  Arnette Felts, FNP   I, Arnette Felts, FNP, have reviewed all documentation for this visit. The documentation on 05/10/20 for the exam, diagnosis, procedures, and orders are all accurate and complete.   IF YOU HAVE BEEN REFERRED TO A SPECIALIST, IT MAY TAKE 1-2 WEEKS TO SCHEDULE/PROCESS THE REFERRAL. IF YOU HAVE NOT HEARD FROM US/SPECIALIST IN TWO WEEKS, PLEASE GIVE [place] A CALL AT [phone].   THE PATIENT IS ENCOURAGED TO PRACTICE SOCIAL DISTANCING DUE TO THE COVID-19 PANDEMIC.

## 2020-06-06 ENCOUNTER — Other Ambulatory Visit: Payer: Self-pay

## 2020-06-06 ENCOUNTER — Ambulatory Visit
Admission: EM | Admit: 2020-06-06 | Discharge: 2020-06-06 | Disposition: A | Discharge status: Home / Self Care | Payer: BC Managed Care – PPO | Attending: Emergency Medicine | Admitting: Emergency Medicine

## 2020-06-06 DIAGNOSIS — J069 Acute upper respiratory infection, unspecified: Secondary | ICD-10-CM | POA: Diagnosis not present

## 2020-06-06 NOTE — ED Triage Notes (Signed)
Pt presents with complaints of congestion, coughing, sneezing, and nasal drainage x 5 days. Reports negative pcr covid test on Thursday. Reports low grade fever, feeling warm. Complains of feeling generally ill.

## 2020-06-06 NOTE — ED Provider Notes (Signed)
Catherine Montgomery    CSN: [id number] Arrival date & time: 06/06/20  0802      History   Chief Complaint Chief Complaint  Patient presents with  . Nasal Congestion    HPI Catherine Montgomery is a 40 y.o. female.   Patient presents with 5-day history of low-grade fever, fatigue, nasal congestion, drainage, sneezing, cough.  She did not take her temperature but felt warm.  No treatments attempted at home.  She denies rash, sore throat, shortness of breath, vomiting, diarrhea, or other symptoms.  Her medical history includes allergies, anemia, obesity, anxiety.  The history is provided by the patient and medical records.    Past Medical History:  Diagnosis Date  . Anemia   . Anxiety   . Bell's palsy    Lt side  . Environmental allergies   . Obesity (BMI 35.0-39.9 without comorbidity)   . Vulvar intraepithelial neoplasia (VIN) grade 3     Patient Active Problem List   Diagnosis Date Noted  . At high risk for breast cancer 01/29/2016  . Status post cesarean section 04/18/2015    Past Surgical History:  Procedure Laterality Date  . CESAREAN SECTION    . CESAREAN SECTION N/A 04/18/2015   Procedure: CESAREAN SECTION;  Surgeon: Conard Novak, MD;  Location: [facility] ORS;  Service: Obstetrics;  Laterality: N/A;  . EYE SURGERY  in college  . VULVA /PERINEUM BIOPSY     VIN 3    OB History    Gravida  4   Para  2   Term  2   Preterm      AB  2   Living  2     SAB      IAB  2   Ectopic      Multiple  0   Live Births  2            Home Medications    Prior to Admission medications   Medication Sig Start Date End Date Taking? Authorizing Provider  cetirizine (ZYRTEC) 10 MG chewable tablet Chew 10 mg by mouth daily.    [provider]  fluticasone (FLONASE) 50 MCG/ACT nasal spray Place 2 sprays into both nostrils daily.    [provider]  Glycopyrronium Tosylate 2.4 % PADS Apply topically.    [provider]  ketoconazole  (NIZORAL) 2 % shampoo USE FOR 1-2WEEKS ALLOW TO REMAIN ON DRY SCALP FOR THEN WASH OUT FOLLOWED BY REG SHAMPOO 07/20/16   [provider]  Multiple Vitamin (MULTIVITAMIN) tablet Take 1 tablet by mouth daily.    [provider]  sertraline (ZOLOFT) 50 MG tablet Take 50 mg by mouth daily.    [provider]    Family History Family History  Problem Relation Age of Onset  . Hypertension Mother   . Diabetes Father   . Hypertension Father   . Diabetes Sister        not anymore  . Diabetes Maternal Grandmother   . Diabetes Maternal Grandfather   . Breast cancer Paternal Grandmother        57's  . Breast cancer Paternal Uncle 56  . Colon cancer Paternal Uncle   . Breast cancer Maternal Aunt 33    Social History Social History   Tobacco Use  . Smoking status: Never Smoker  . Smokeless tobacco: Never Used  Vaping Use  . Vaping Use: Never used  Substance Use Topics  . Alcohol use: No  . Drug use:  No     Allergies   Patient has no known allergies.   Review of Systems Review of Systems  Constitutional: Positive for fatigue and fever. Negative for chills.  HENT: Positive for congestion, postnasal drip, rhinorrhea and sneezing. Negative for ear pain and sore throat.   Respiratory: Positive for cough. Negative for shortness of breath.   Cardiovascular: Negative for chest pain and palpitations.  Gastrointestinal: Negative for abdominal pain, diarrhea and vomiting.  Skin: Negative for color change and rash.  All other systems reviewed and are negative.    Physical Exam Triage Vital Signs ED Triage Vitals  Enc Vitals Group     BP      Pulse      Resp      Temp      Temp src      SpO2      Weight      Height      Head Circumference      Peak Flow      Pain Score      Pain Loc      Pain Edu?      Excl. in GC?    No data found.  Updated Vital Signs BP 111/80   Pulse 82   Temp 98.2 F (36.8 C)   Resp 19   LMP 05/09/2020   SpO2  98%   Visual Acuity Right Eye Distance:   Left Eye Distance:   Bilateral Distance:    Right Eye Near:   Left Eye Near:    Bilateral Near:     Physical Exam Vitals and nursing note reviewed.  Constitutional:      General: She is not in acute distress.    Appearance: She is well-developed. She is not ill-appearing.  HENT:     Head: Normocephalic and atraumatic.     Right Ear: Tympanic membrane normal.     Left Ear: Tympanic membrane normal.     Nose: Congestion present.     Mouth/Throat:     Mouth: Mucous membranes are moist.     Pharynx: Oropharynx is clear.     Comments: Clear postnasal drip. Eyes:     Conjunctiva/sclera: Conjunctivae normal.  Cardiovascular:     Rate and Rhythm: Normal rate and regular rhythm.     Heart sounds: Normal heart sounds.  Pulmonary:     Effort: Pulmonary effort is normal. No respiratory distress.     Breath sounds: Normal breath sounds.  Abdominal:     Palpations: Abdomen is soft.     Tenderness: There is no abdominal tenderness.  Musculoskeletal:     Cervical back: Neck supple.  Skin:    General: Skin is warm and dry.  Neurological:     General: No focal deficit present.     Mental Status: She is alert and oriented to person, place, and time.     Gait: Gait normal.  Psychiatric:        Mood and Affect: Mood normal.        Behavior: Behavior normal.      UC Treatments / Results  Labs (all labs ordered are listed, but only abnormal results are displayed) Labs Reviewed  NOVEL CORONAVIRUS, NAA    EKG   Radiology No results found.  Procedures Procedures (including critical care time)  Medications Ordered in UC Medications - No data to display  Initial Impression / Assessment and Plan / UC Course  I have reviewed the triage vital signs and the nursing notes.  Pertinent labs & imaging results that were available during my care of the patient were reviewed by me and considered in my medical decision making (see chart for  details).   Viral URI with cough.  COVID pending.  Instructed patient to self quarantine until the test result is back.  Discussed symptomatic treatment including Tylenol or ibuprofen, plain Mucinex, rest, hydration.  Instructed patient to follow up with PCP if her symptoms are not improving.  Patient agrees to plan of care.    Final Clinical Impressions(s) / UC Diagnoses   Final diagnoses:  Viral URI with cough     Discharge Instructions     Your COVID test is pending.  You should self quarantine until the test result is back.    Take Tylenol or ibuprofen as needed for fever or discomfort.  Take plain over-the-counter Mucinex as needed for congestion.  Rest and keep yourself hydrated.    Follow-up with your primary care provider if your symptoms are not improving.        ED Prescriptions    None     PDMP not reviewed this encounter.   Mickie Bail, NP 06/06/20 [phone]

## 2020-06-06 NOTE — Discharge Instructions (Signed)
Your COVID test is pending.  You should self quarantine until the test result is back.    Take Tylenol or ibuprofen as needed for fever or discomfort.  Take plain over-the-counter Mucinex as needed for congestion.  Rest and keep yourself hydrated.    Follow-up with your primary care provider if your symptoms are not improving.     

## 2020-06-07 LAB — SARS-COV-2, NAA 2 DAY TAT

## 2020-06-07 LAB — NOVEL CORONAVIRUS, NAA: SARS-CoV-2, NAA: NOT DETECTED

## 2020-06-11 DIAGNOSIS — T7840XA Allergy, unspecified, initial encounter: Secondary | ICD-10-CM | POA: Insufficient documentation

## 2020-07-30 ENCOUNTER — Telehealth: Payer: BC Managed Care – PPO | Admitting: Family

## 2020-07-30 ENCOUNTER — Encounter: Payer: Self-pay | Admitting: Family

## 2020-07-30 DIAGNOSIS — U071 COVID-19: Secondary | ICD-10-CM | POA: Diagnosis not present

## 2020-07-30 MED ORDER — ALBUTEROL SULFATE HFA 108 (90 BASE) MCG/ACT IN AERS: 2.0000 | INHALATION_SPRAY | Freq: Four times a day (QID) | RESPIRATORY_TRACT | 0 refills | Status: DC | PRN

## 2020-07-30 MED ORDER — BENZONATATE 100 MG PO CAPS: 100.0000 mg | ORAL_CAPSULE | Freq: Three times a day (TID) | ORAL | 0 refills | Status: DC | PRN

## 2020-07-30 MED ORDER — NIRMATRELVIR/RITONAVIR (PAXLOVID)TABLET
3.0000 | ORAL_TABLET | Freq: Two times a day (BID) | ORAL | 0 refills | Status: AC
Start: 2020-07-30 — End: 2020-08-04

## 2020-07-30 NOTE — Progress Notes (Signed)
Virtual Visit Consent   Catherine Montgomery, you are scheduled for a virtual visit with a [facility] provider today.     Just as with appointments in the office, your consent must be obtained to participate.  Your consent will be active for this visit and any virtual visit you may have with one of our providers in the next 365 days.     If you have a MyChart account, a copy of this consent can be sent to you electronically.  All virtual visits are billed to your insurance company just like a traditional visit in the office.    As this is a virtual visit, video technology does not allow for your provider to perform a traditional examination.  This may limit your provider's ability to fully assess your condition.  If your provider identifies any concerns that need to be evaluated in person or the need to arrange testing (such as labs, EKG, etc.), we will make arrangements to do so.     Although advances in technology are sophisticated, we cannot ensure that it will always work on either your end or our end.  If the connection with a video visit is poor, the visit may have to be switched to a telephone visit.  With either a video or telephone visit, we are not always able to ensure that we have a secure connection.     I need to obtain your verbal consent now.   Are you willing to proceed with your visit today?    Catherine Montgomery has provided verbal consent on 07/30/2020 for a virtual visit (video or telephone).   Catherine Rodney, FNP   Date: 07/30/2020 9:22 AM   Virtual Visit via Video Note   I, Catherine Montgomery, connected with  Catherine Montgomery  ([id number], 10-Jan-1980) on 07/30/20 at  9:30 AM EDT by a video-enabled telemedicine application and verified that I am speaking with the correct person using two identifiers.  Location: Patient: Virtual Visit Location Patient: Home Provider: Virtual Visit Location Provider: Office/Clinic   I discussed the limitations of evaluation and management by  telemedicine and the availability of in person appointments. The patient expressed understanding and agreed to proceed.    History of Present Illness: Catherine Montgomery is a 40 y.o. who identifies as a female who was assigned female at birth, and is being seen today for COVID positive today. She reports her symptoms started 07/28/20.   HPI: Cough This is a new problem. The current episode started in the past 7 days. The problem has been unchanged. The problem occurs every few minutes. The cough is Non-productive. Associated symptoms include chills, ear congestion, ear pain, a fever, headaches, myalgias, nasal congestion, postnasal drip and a sore throat. Pertinent negatives include no shortness of breath. She has tried rest (zyrtec) for the symptoms. The treatment provided mild relief.   Problems:  Patient Active Problem List   Diagnosis Date Noted   At high risk for breast cancer 01/29/2016   Status post cesarean section 04/18/2015    Allergies: No Known Allergies Medications:  Current Outpatient Medications:    albuterol (VENTOLIN HFA) 108 (90 Base) MCG/ACT inhaler, Inhale 2 puffs into the lungs every 6 (six) hours as needed for wheezing or shortness of breath., Disp: 8 g, Rfl: 0   benzonatate (TESSALON PERLES) 100 MG capsule, Take 1 capsule (100 mg total) by mouth 3 (three) times daily as needed., Disp: 20 capsule, Rfl: 0   nirmatrelvir/ritonavir EUA (PAXLOVID) TABS, Take  3 tablets by mouth 2 (two) times daily for 5 days., Disp: 30 tablet, Rfl: 0   cetirizine (ZYRTEC) 10 MG chewable tablet, Chew 10 mg by mouth daily., Disp: , Rfl:    fluticasone (FLONASE) 50 MCG/ACT nasal spray, Place 2 sprays into both nostrils daily., Disp: , Rfl:    Glycopyrronium Tosylate 2.4 % PADS, Apply topically., Disp: , Rfl:    ketoconazole (NIZORAL) 2 % shampoo, USE FOR 1-2WEEKS ALLOW TO REMAIN ON DRY SCALP FOR THEN WASH OUT FOLLOWED BY REG SHAMPOO, Disp: , Rfl: 1   Multiple Vitamin (MULTIVITAMIN) tablet,  Take 1 tablet by mouth daily., Disp: , Rfl:    sertraline (ZOLOFT) 50 MG tablet, Take 50 mg by mouth daily., Disp: , Rfl:   Observations/Objective: Patient is well-developed, well-nourished in no acute distress.  Resting comfortably  at home.  Head is normocephalic, atraumatic.  No labored breathing. No SOB or distress noted Speech is clear and coherent with logical content.  Patient is alert and oriented at baseline.    Assessment and Plan: 1. COVID-19 virus detected - nirmatrelvir/ritonavir EUA (PAXLOVID) TABS; Take 3 tablets by mouth 2 (two) times daily for 5 days.  Dispense: 30 tablet; Refill: 0 - benzonatate (TESSALON PERLES) 100 MG capsule; Take 1 capsule (100 mg total) by mouth 3 (three) times daily as needed.  Dispense: 20 capsule; Refill: 0 - albuterol (VENTOLIN HFA) 108 (90 Base) MCG/ACT inhaler; Inhale 2 puffs into the lungs every 6 (six) hours as needed for wheezing or shortness of breath.  Dispense: 8 g; Refill: 0 COVID positive, rest, force fluids, tylenol as needed, Quarantine for at least 5 days and fever free, report any worsening symptoms such as increased shortness of breath, swelling, or continued high fevers.  Possible adverse effects discussed with Paxlovid  Follow Up Instructions: I discussed the assessment and treatment plan with the patient. The patient was provided an opportunity to ask questions and all were answered. The patient agreed with the plan and demonstrated an understanding of the instructions.  A copy of instructions were sent to the patient via MyChart.  The patient was advised to call back or seek an in-person evaluation if the symptoms worsen or if the condition fails to improve as anticipated.  Time:  I spent 8 minutes with the patient via telehealth technology discussing the above problems/concerns.    Catherine Rodney, FNP

## 2020-08-21 ENCOUNTER — Other Ambulatory Visit: Payer: Self-pay | Admitting: Family

## 2020-08-21 DIAGNOSIS — U071 COVID-19: Secondary | ICD-10-CM

## 2020-10-19 ENCOUNTER — Other Ambulatory Visit: Payer: Self-pay | Admitting: Internal Medicine

## 2020-10-19 DIAGNOSIS — Z79899 Other long term (current) drug therapy: Secondary | ICD-10-CM

## 2020-10-26 ENCOUNTER — Ambulatory Visit (INDEPENDENT_AMBULATORY_CARE_PROVIDER_SITE_OTHER): Payer: BC Managed Care – PPO | Admitting: Nurse Practitioner

## 2020-10-26 ENCOUNTER — Other Ambulatory Visit: Payer: Self-pay

## 2020-10-26 ENCOUNTER — Encounter: Payer: Self-pay | Admitting: Nurse Practitioner

## 2020-10-26 VITALS — BP 118/78 | HR 78 | Resp 18

## 2020-10-26 DIAGNOSIS — J0110 Acute frontal sinusitis, unspecified: Secondary | ICD-10-CM | POA: Diagnosis not present

## 2020-10-26 DIAGNOSIS — R059 Cough, unspecified: Secondary | ICD-10-CM

## 2020-10-26 DIAGNOSIS — R0981 Nasal congestion: Secondary | ICD-10-CM | POA: Diagnosis not present

## 2020-10-26 MED ORDER — AMOXICILLIN-POT CLAVULANATE 875-125 MG PO TABS: 1.0000 | ORAL_TABLET | Freq: Two times a day (BID) | ORAL | 0 refills | Status: AC

## 2020-10-26 MED ORDER — HYDROCODONE BIT-HOMATROP MBR 5-1.5 MG/5ML PO SOLN: 5.0000 mL | Freq: Four times a day (QID) | ORAL | 0 refills | Status: DC | PRN

## 2020-10-26 NOTE — Progress Notes (Signed)
This visit occurred during the SARS-CoV-2 public health emergency.  Safety protocols were in place, including screening questions prior to the visit, additional usage of staff PPE, and extensive cleaning of exam room while observing appropriate contact time as indicated for disinfecting solutions.  Subjective:     Patient ID: Catherine Montgomery , female    DOB: February 12, 1980 , 40 y.o.   MRN: [id number]   Chief Complaint  Patient presents with   Sinusitis     HPI  Patient is here for purulent discharge. She does not have a  fever, coughing thick mucus.  She does not have shortness of breath except sometimes at nighttime when she is coughing.  She has tested for COVID and is not positive. She does have some pain in the left ear. She does have some tenderness to her sinuses with HA. She states that her cough is very bothersome.      Past Medical History:  Diagnosis Date   Anemia    Anxiety    Bell's palsy    Lt side   Environmental allergies    Obesity (BMI 35.0-39.9 without comorbidity)    Vulvar intraepithelial neoplasia (VIN) grade 3      Family History  Problem Relation Age of Onset   Hypertension Mother    Diabetes Father    Hypertension Father    Diabetes Sister        not anymore   Diabetes Maternal Grandmother    Diabetes Maternal Grandfather    Breast cancer Paternal Grandmother        75's   Breast cancer Paternal Uncle 33   Colon cancer Paternal Uncle    Breast cancer Maternal Aunt 45     Current Outpatient Medications:    amoxicillin-clavulanate (AUGMENTIN) 875-125 MG tablet, Take 1 tablet by mouth 2 (two) times daily for 10 days., Disp: 20 tablet, Rfl: 0   HYDROcodone bit-homatropine (HYCODAN) 5-1.5 MG/5ML syrup, Take 5 mLs by mouth every 6 (six) hours as needed for cough., Disp: 120 mL, Rfl: 0   albuterol (VENTOLIN HFA) 108 (90 Base) MCG/ACT inhaler, Inhale 2 puffs into the lungs every 6 (six) hours as needed for wheezing or shortness of breath., Disp: 8 g,  Rfl: 0   benzonatate (TESSALON PERLES) 100 MG capsule, Take 1 capsule (100 mg total) by mouth 3 (three) times daily as needed., Disp: 20 capsule, Rfl: 0   cetirizine (ZYRTEC) 10 MG chewable tablet, Chew 10 mg by mouth daily., Disp: , Rfl:    fluticasone (FLONASE) 50 MCG/ACT nasal spray, Place 2 sprays into both nostrils daily., Disp: , Rfl:    Glycopyrronium Tosylate 2.4 % PADS, Apply topically., Disp: , Rfl:    ketoconazole (NIZORAL) 2 % shampoo, USE FOR 1-2WEEKS ALLOW TO REMAIN ON DRY SCALP FOR THEN WASH OUT FOLLOWED BY REG SHAMPOO, Disp: , Rfl: 1   Multiple Vitamin (MULTIVITAMIN) tablet, Take 1 tablet by mouth daily., Disp: , Rfl:    sertraline (ZOLOFT) 50 MG tablet, Take 50 mg by mouth daily., Disp: , Rfl:    No Known Allergies   Review of Systems  Constitutional:  Negative for chills, fatigue and fever.  HENT:  Positive for congestion, ear pain, postnasal drip, rhinorrhea, sinus pressure and sinus pain. Negative for sneezing.   Respiratory:  Positive for cough. Negative for chest tightness, shortness of breath and wheezing.   Cardiovascular:  Negative for chest pain and palpitations.  Gastrointestinal:  Negative for abdominal distention, constipation, diarrhea, nausea and vomiting.  Endocrine:  Negative for polydipsia, polyphagia and polyuria.  Musculoskeletal:  Negative for arthralgias and myalgias.  Neurological:  Positive for headaches. Negative for tremors and weakness.    Today's Vitals   10/26/20 1338  BP: 118/78  Pulse: 78  Resp: 18  SpO2: 98%   There is no height or weight on file to calculate BMI.   Objective:  Physical Exam Constitutional:      Appearance: Normal appearance. She is obese.  HENT:     Head: Normocephalic and atraumatic.     Right Ear: Tympanic membrane, ear canal and external ear normal. No swelling or tenderness. There is no impacted cerumen. No mastoid tenderness. Tympanic membrane is not bulging.     Left Ear: Ear canal and external ear  normal. No drainage, swelling or tenderness. There is impacted cerumen. No mastoid tenderness. Tympanic membrane is bulging.     Ears:     Comments: Redness in the left ear without drainage.  Cardiovascular:     Rate and Rhythm: Normal rate and regular rhythm.     Pulses: Normal pulses.     Heart sounds: Normal heart sounds.  Pulmonary:     Effort: Pulmonary effort is normal. No respiratory distress.     Breath sounds: No wheezing.  Skin:    General: Skin is warm and dry.     Capillary Refill: Capillary refill takes less than 2 seconds.  Neurological:     Mental Status: She is alert and oriented to person, place, and time.       Assessment And Plan:     1. Acute non-recurrent frontal sinusitis - amoxicillin-clavulanate (AUGMENTIN) 875-125 MG tablet; Take 1 tablet by mouth 2 (two) times daily for 10 days.  Dispense: 20 tablet; Refill: 0  2. Nasal congestion - amoxicillin-clavulanate (AUGMENTIN) 875-125 MG tablet; Take 1 tablet by mouth 2 (two) times daily for 10 days.  Dispense: 20 tablet; Refill: 0  3. Cough, unspecified type - HYDROcodone bit-homatropine (HYCODAN) 5-1.5 MG/5ML syrup; Take 5 mLs by mouth every 6 (six) hours as needed for cough.  Dispense: 120 mL; Refill: 0  -Advised patient to take cough syrup as needed.  -Advised patient to not drive or operate machinery while taking this medication.   The patient was encouraged to call or send a message through MyChart for any questions or concerns.   Follow up: if symptoms persist or do not get better.   Side effects and appropriate use of all the medication(s) were discussed with the patient today. Patient advised to use the medication(s) as directed by their healthcare provider. The patient was encouraged to read, review, and understand all associated package inserts and contact our office with any questions or concerns. The patient accepts the risks of the treatment plan and had an opportunity to ask questions.   Advised  patient to take Vitamin C, D, Zinc.  Keep yourself hydrated with a lot of water and rest. Take Delsym for cough and Mucinex as need. Take Tylenol or pain reliever every 4-6 hours as needed for pain/fever/body ache. If you have elevated blood pressure, you can take OTC Corcidin. You can also take OTC oscillococcinum to help with your symptoms.  Educated patient if symptoms get worse or if she experiences any SOB, chest pain or pain in her legs to seek immediate emergency care. Continue to monitor your oxygen levels. Call us if you have any questions. Wear a mask around other people.   Staying healthy and adopting a healthy lifestyle for your overall  health is important. You should eat 7 or more servings of fruits and vegetables per day. You should drink plenty of water to keep yourself hydrated and your kidneys healthy. This includes about 65-80+ fluid ounces of water. Limit your intake of animal fats especially for elevated cholesterol. Avoid highly processed food and limit your salt intake if you have hypertension. Avoid foods high in saturated/Trans fats. Along with a healthy diet it is also very important to maintain time for yourself to maintain a healthy mental health with low stress levels. You should get atleast 150 min of moderate intensity exercise weekly for a healthy heart. Along with eating right and exercising, aim for at least 7-9 hours of sleep daily.  Eat more whole grains which includes barley, wheat berries, oats, brown rice and whole wheat pasta. Use healthy plant oils which include olive, soy, corn, sunflower and peanut. Limit your caffeine and sugary drinks. Limit your intake of fast foods. Limit milk and dairy products to one or two daily servings.   Patient was given opportunity to ask questions. Patient verbalized understanding of the plan and was able to repeat key elements of the plan. All questions were answered to their satisfaction.  Raman Chia Rock, DNP   I, Raman Lem Peary have  reviewed all documentation for this visit. The documentation on 10/26/20 for the exam, diagnosis, procedures, and orders are all accurate and complete.    THE PATIENT IS ENCOURAGED TO PRACTICE SOCIAL DISTANCING DUE TO THE COVID-19 PANDEMIC.

## 2020-10-31 ENCOUNTER — Ambulatory Visit: Payer: BC Managed Care – PPO

## 2020-12-19 ENCOUNTER — Other Ambulatory Visit: Payer: Self-pay | Admitting: Nurse Practitioner

## 2020-12-19 DIAGNOSIS — R7303 Prediabetes: Secondary | ICD-10-CM

## 2020-12-19 MED ORDER — OZEMPIC (1 MG/DOSE) 4 MG/3ML ~~LOC~~ SOPN: 1.0000 mg | PEN_INJECTOR | SUBCUTANEOUS | 1 refills | Status: DC

## 2021-04-17 ENCOUNTER — Other Ambulatory Visit: Payer: Self-pay | Admitting: Internal Medicine

## 2021-04-17 MED ORDER — OLOPATADINE HCL 0.2 % OP SOLN: 1.0000 [drp] | Freq: Every day | OPHTHALMIC | 2 refills | Status: AC | PRN

## 2021-05-16 ENCOUNTER — Other Ambulatory Visit: Payer: Self-pay | Admitting: Nurse Practitioner

## 2021-05-16 MED ORDER — OZEMPIC (2 MG/DOSE) 8 MG/3ML ~~LOC~~ SOPN: 2.0000 mg | PEN_INJECTOR | SUBCUTANEOUS | 1 refills | Status: DC

## 2021-05-17 ENCOUNTER — Other Ambulatory Visit: Payer: Self-pay

## 2021-05-17 DIAGNOSIS — R0981 Nasal congestion: Secondary | ICD-10-CM

## 2021-05-18 LAB — NOVEL CORONAVIRUS, NAA: SARS-CoV-2, NAA: NOT DETECTED

## 2021-05-21 ENCOUNTER — Ambulatory Visit (INDEPENDENT_AMBULATORY_CARE_PROVIDER_SITE_OTHER): Payer: 59

## 2021-05-21 ENCOUNTER — Encounter: Payer: Self-pay | Admitting: Emergency Medicine

## 2021-05-21 ENCOUNTER — Other Ambulatory Visit: Payer: Self-pay

## 2021-05-21 ENCOUNTER — Ambulatory Visit
Admission: EM | Admit: 2021-05-21 | Discharge: 2021-05-21 | Disposition: A | Discharge status: Home / Self Care | Payer: 59 | Attending: Emergency Medicine | Admitting: Emergency Medicine

## 2021-05-21 DIAGNOSIS — R058 Other specified cough: Secondary | ICD-10-CM

## 2021-05-21 DIAGNOSIS — B349 Viral infection, unspecified: Secondary | ICD-10-CM | POA: Diagnosis not present

## 2021-05-21 DIAGNOSIS — J029 Acute pharyngitis, unspecified: Secondary | ICD-10-CM | POA: Diagnosis not present

## 2021-05-21 DIAGNOSIS — R0602 Shortness of breath: Secondary | ICD-10-CM | POA: Diagnosis not present

## 2021-05-21 LAB — POCT RAPID STREP A (OFFICE): Rapid Strep A Screen: NEGATIVE

## 2021-05-21 MED ORDER — BENZONATATE 100 MG PO CAPS: 100.0000 mg | ORAL_CAPSULE | Freq: Three times a day (TID) | ORAL | 0 refills | Status: DC | PRN

## 2021-05-21 MED ORDER — PREDNISONE 10 MG (21) PO TBPK: ORAL_TABLET | Freq: Every day | ORAL | 0 refills | Status: DC

## 2021-05-21 MED ORDER — LIDOCAINE VISCOUS HCL 2 % MT SOLN: 15.0000 mL | OROMUCOSAL | 0 refills | Status: AC | PRN

## 2021-05-21 MED ORDER — ALBUTEROL SULFATE HFA 108 (90 BASE) MCG/ACT IN AERS: 1.0000 | INHALATION_SPRAY | Freq: Four times a day (QID) | RESPIRATORY_TRACT | 0 refills | Status: AC | PRN

## 2021-05-21 NOTE — ED Triage Notes (Addendum)
Patient c/o hoarseness and sore throat x 4 days.  ? ?Patient endorses chest congestions. Patient endorses productive cough w/ purulent sputum.  ? ?Patient endorses feels throat tightness.  ? ?Patient endorses a temperature of 101F at home at it's highest.  ? ?Patient endorses cervical lymph node tenderness.  ? ?Patient endorses her kids are sick with a viral infection at home.  ? ?Patient took a COVID test at home with a negative result. ? ?Patient has taken Tylenol, ibuprofen, and "cold OTC" medications with no relief of symptoms.   ? ? ?

## 2021-05-21 NOTE — ED Provider Notes (Signed)
?[facility] ? ? ? ?CSN: [id number] ?Arrival date & time: 05/21/21  1141 ? ? ?  ? ?History   ?Chief Complaint ?Chief Complaint  ?Patient presents with  ? Hoarse  ? Sore Throat  ? ? ?HPI ?Catherine Montgomery is a 41 y.o. female.  Patient presents with 4-day history of fever, sore throat, hoarse voice, congestion, productive cough, wheezing, shortness of breath.  Treating symptoms with OTC cold medication.  No rash, vomiting, diarrhea, or other symptoms.  Negative COVID test on 05/17/2021.  Her medical history includes obesity, prediabetes, hyperlipidemia, adjustment reaction with anxiety and depression, attention and concentration deficit ? ?The history is provided by the patient and medical records.  ? ?Past Medical History:  ?Diagnosis Date  ? Anemia   ? Anxiety   ? Bell's palsy   ? Lt side  ? Environmental allergies   ? Obesity (BMI 35.0-39.9 without comorbidity)   ? Vulvar intraepithelial neoplasia (VIN) grade 3   ? ? ?Patient Active Problem List  ? Diagnosis Date Noted  ? At high risk for breast cancer 01/29/2016  ? Status post cesarean section 04/18/2015  ? ? ?Past Surgical History:  ?Procedure Laterality Date  ? CESAREAN SECTION    ? CESAREAN SECTION N/A 04/18/2015  ? Procedure: CESAREAN SECTION;  Surgeon: Conard Novak, MD;  Location: [facility] ORS;  Service: Obstetrics;  Laterality: N/A;  ? EYE SURGERY  in college  ? VULVA /PERINEUM BIOPSY    ? VIN 3  ? ? ?OB History   ? ? Gravida  ?4  ? Para  ?2  ? Term  ?2  ? Preterm  ?   ? AB  ?2  ? Living  ?2  ?  ? ? SAB  ?   ? IAB  ?2  ? Ectopic  ?   ? Multiple  ?0  ? Live Births  ?2  ?   ?  ?  ? ? ? ?Home Medications   ? ?Prior to Admission medications   ?Medication Sig Start Date End Date Taking? Authorizing Provider  ?albuterol (VENTOLIN HFA) 108 (90 Base) MCG/ACT inhaler Inhale 1-2 puffs into the lungs every 6 (six) hours as needed for wheezing or shortness of breath. 05/21/21  Yes Mickie Bail, NP  ?azelastine (ASTELIN) 0.1 % nasal spray Place 1 spray into both  nostrils 2 (two) times daily. Use in each nostril as directed   Yes [provider]  ?benzonatate (TESSALON) 100 MG capsule Take 1 capsule (100 mg total) by mouth 3 (three) times daily as needed for cough. 05/21/21  Yes Mickie Bail, NP  ?cetirizine (ZYRTEC) 10 MG chewable tablet Chew 10 mg by mouth daily.   Yes [provider]  ?Glycopyrronium Tosylate 2.4 % PADS Apply topically.   Yes [provider]  ?ketoconazole (NIZORAL) 2 % shampoo USE FOR 1-2WEEKS ALLOW TO REMAIN ON DRY SCALP FOR THEN WASH OUT FOLLOWED BY REG SHAMPOO 07/20/16  Yes [provider]  ?lidocaine (XYLOCAINE) 2 % solution Use as directed 15 mLs in the mouth or throat as needed for mouth pain. 05/21/21  Yes Mickie Bail, NP  ?Multiple Vitamin (MULTIVITAMIN) tablet Take 1 tablet by mouth daily.   Yes [provider]  ?predniSONE (STERAPRED UNI-PAK 21 TAB) 10 MG (21) TBPK tablet Take by mouth daily. As directed 05/21/21  Yes Mickie Bail, NP  ?Semaglutide, 2 MG/DOSE, (OZEMPIC, 2 MG/DOSE,) 8 MG/3ML SOPN Inject 2 mg into the skin once a week. 05/16/21  Yes Arnette FeltsMoore, Janece, FNP  ?sertraline (ZOLOFT) 50 MG tablet Take 50 mg by mouth daily.   Yes [provider]  ?fluticasone (FLONASE) 50 MCG/ACT nasal spray Place 2 sprays into both nostrils daily.    [provider]  ?HYDROcodone bit-homatropine (HYCODAN) 5-1.5 MG/5ML syrup Take 5 mLs by mouth every 6 (six) hours as needed for cough. 10/26/20   Charlesetta IvoryGhumman, Ramandeep, NP  ?Olopatadine HCl 0.2 % SOLN Apply 1 drop to eye daily as needed. 04/17/21   Dorothyann PengSanders, Robyn, MD  ? ? ?Family History ?Family History  ?Problem Relation Age of Onset  ? Hypertension Mother   ? Diabetes Father   ? Hypertension Father   ? Diabetes Sister   ?     not anymore  ? Diabetes Maternal Grandmother   ? Diabetes Maternal Grandfather   ? Breast cancer Paternal Grandmother   ?     7040's  ? Breast cancer Paternal Uncle [id number]  ? Colon cancer Paternal Uncle   ? Breast cancer Maternal  Aunt 45  ? ? ?Social History ?Social History  ? ?Tobacco Use  ? Smoking status: Never  ? Smokeless tobacco: Never  ?Vaping Use  ? Vaping Use: Never used  ?Substance Use Topics  ? Alcohol use: No  ? Drug use: No  ? ? ? ?Allergies   ?Patient has no known allergies. ? ? ?Review of Systems ?Review of Systems  ?Constitutional:  Positive for fever. Negative for chills.  ?HENT:  Positive for congestion, sore throat and voice change. Negative for ear pain.   ?Respiratory:  Positive for cough, shortness of breath and wheezing.   ?Cardiovascular:  Negative for chest pain and palpitations.  ?Gastrointestinal:  Negative for diarrhea and vomiting.  ?Skin:  Negative for color change and rash.  ?All other systems reviewed and are negative. ? ? ?Physical Exam ?Triage Vital Signs ?ED Triage Vitals  ?Enc Vitals Group  ?   BP 05/21/21 1204 119/75  ?   Pulse Rate 05/21/21 1154 82  ?   Resp 05/21/21 1154 18  ?   Temp 05/21/21 1154 98.9 ?F (37.2 ?C)  ?   Temp src --   ?   SpO2 05/21/21 1154 100 %  ?   Weight --   ?   Height --   ?   Head Circumference --   ?   Peak Flow --   ?   Pain Score 05/21/21 1159 4  ?   Pain Loc --   ?   Pain Edu? --   ?   Excl. in GC? --   ? ?No data found. ? ?Updated Vital Signs ?BP 119/75 (BP Location: Left Arm)   Pulse 82   Temp 98.9 ?F (37.2 ?C)   Resp 18   LMP 05/17/2021 (Exact Date)   SpO2 100%  ? ?Visual Acuity ?Right Eye Distance:   ?Left Eye Distance:   ?Bilateral Distance:   ? ?Right Eye Near:   ?Left Eye Near:    ?Bilateral Near:    ? ?Physical Exam ?Vitals and nursing note reviewed.  ?Constitutional:   ?   General: She is not in acute distress. ?   Appearance: She is well-developed. She is ill-appearing.  ?HENT:  ?   Right Ear: Tympanic membrane normal.  ?   Left Ear: Tympanic membrane normal.  ?   Nose: Nose normal.  ?   Mouth/Throat:  ?   Mouth: Mucous membranes are moist.  ?   Pharynx: Posterior oropharyngeal erythema present.  ?  Cardiovascular:  ?   Rate and Rhythm: Normal rate and regular  rhythm.  ?   Heart sounds: Normal heart sounds.  ?Pulmonary:  ?   Effort: Pulmonary effort is normal. No respiratory distress.  ?   Breath sounds: Normal breath sounds. No wheezing.  ?Musculoskeletal:  ?   Cervical back: Neck supple.  ?Skin: ?   General: Skin is warm and dry.  ?Neurological:  ?   Mental Status: She is alert.  ?Psychiatric:     ?   Mood and Affect: Mood normal.     ?   Behavior: Behavior normal.  ? ? ? ?UC Treatments / Results  ?Labs ?(all labs ordered are listed, but only abnormal results are displayed) ?Labs Reviewed  ?CULTURE, GROUP A STREP [facility])  ?COVID-19, FLU A+B AND RSV  ?POCT RAPID STREP A (OFFICE)  ? ? ?EKG ? ? ?Radiology ?DG Chest 2 View ? ?Result Date: 05/21/2021 ?CLINICAL DATA:  Productive cough. EXAM: CHEST - 2 VIEW COMPARISON:  Chest x-ray dated December 11, 2005. FINDINGS: The heart size and mediastinal contours are within normal limits. Both lungs are clear. The visualized skeletal structures are unremarkable. IMPRESSION: No active cardiopulmonary disease. Electronically Signed   By: Obie Dredge M.D.   On: 05/21/2021 12:49   ? ?Procedures ?Procedures (including critical care time) ? ?Medications Ordered in UC ?Medications - No data to display ? ?Initial Impression / Assessment and Plan / UC Course  ?I have reviewed the triage vital signs and the nursing notes. ? ?Pertinent labs & imaging results that were available during my care of the patient were reviewed by me and considered in my medical decision making (see chart for details). ? ?  ?Viral illness, productive cough, shortness of breath, pharyngitis.  Rapid strep negative; culture pending.  Respiratory panel pending.  Chest x-ray negative.  Treating with prednisone, albuterol inhaler, viscous lidocaine, Tessalon Perles.  Discussed possible prolonged QT with albuterol, patient states she has used this before without problem; she is a Teacher, early years/pre.   ED precautions discussed.  Education provided on viral illness, shortness of  breath, pharyngitis.  Instructed patient to follow-up with her PCP.  She agrees to plan of care. ? ?Final Clinical Impressions(s) / UC Diagnoses  ? ?Final diagnoses:  ?Viral illness  ?Productive cough  ?Shortness

## 2021-05-21 NOTE — Discharge Instructions (Addendum)
Take the prednisone and use the albuterol inhaler as directed.  Use the viscous lidocaine as directed for sore throat.  Take the [facility] as directed for cough.   ? ?Your rapid strep is negative and throat culture is pending.  The respiratory panel is pending also.   ? ?Go to the emergency department if you have worsening symptoms.   ? ?Follow up with your primary care provider.   ? ?

## 2021-05-22 LAB — COVID-19, FLU A+B AND RSV
Influenza A, NAA: NOT DETECTED
Influenza B, NAA: NOT DETECTED
RSV, NAA: NOT DETECTED
SARS-CoV-2, NAA: NOT DETECTED

## 2021-05-24 LAB — CULTURE, GROUP A STREP (THRC)

## 2021-08-01 ENCOUNTER — Other Ambulatory Visit: Payer: Self-pay

## 2021-08-01 DIAGNOSIS — Z1152 Encounter for screening for COVID-19: Secondary | ICD-10-CM

## 2021-08-02 LAB — NOVEL CORONAVIRUS, NAA: SARS-CoV-2, NAA: NOT DETECTED

## 2022-01-01 ENCOUNTER — Other Ambulatory Visit: Payer: Self-pay

## 2022-01-01 DIAGNOSIS — Z1231 Encounter for screening mammogram for malignant neoplasm of breast: Secondary | ICD-10-CM

## 2022-01-03 ENCOUNTER — Other Ambulatory Visit (HOSPITAL_BASED_OUTPATIENT_CLINIC_OR_DEPARTMENT_OTHER): Payer: Self-pay

## 2022-01-03 MED ORDER — LISDEXAMFETAMINE DIMESYLATE 50 MG PO CAPS
50.0000 mg | ORAL_CAPSULE | Freq: Every day | ORAL | 0 refills | Status: DC
  Filled 2022-01-04: qty 30, 30d supply, fill #0

## 2022-01-04 ENCOUNTER — Other Ambulatory Visit (HOSPITAL_BASED_OUTPATIENT_CLINIC_OR_DEPARTMENT_OTHER): Payer: Self-pay

## 2022-02-01 ENCOUNTER — Ambulatory Visit
Admission: RE | Admit: 2022-02-01 | Discharge: 2022-02-01 | Disposition: A | Discharge status: Home / Self Care | Payer: 59 | Source: Ambulatory Visit | Attending: Infectious Diseases | Admitting: Infectious Diseases

## 2022-02-01 DIAGNOSIS — Z1231 Encounter for screening mammogram for malignant neoplasm of breast: Secondary | ICD-10-CM | POA: Diagnosis present

## 2022-02-04 ENCOUNTER — Other Ambulatory Visit (HOSPITAL_BASED_OUTPATIENT_CLINIC_OR_DEPARTMENT_OTHER): Payer: Self-pay

## 2022-02-04 MED ORDER — LISDEXAMFETAMINE DIMESYLATE 50 MG PO CAPS
50.0000 mg | ORAL_CAPSULE | Freq: Every day | ORAL | 0 refills | Status: DC
  Filled 2022-02-04: qty 30, 30d supply, fill #0

## 2022-02-06 ENCOUNTER — Other Ambulatory Visit: Payer: Self-pay | Admitting: Infectious Diseases

## 2022-02-06 DIAGNOSIS — N63 Unspecified lump in unspecified breast: Secondary | ICD-10-CM

## 2022-02-06 DIAGNOSIS — R928 Other abnormal and inconclusive findings on diagnostic imaging of breast: Secondary | ICD-10-CM

## 2022-02-07 ENCOUNTER — Ambulatory Visit
Admission: RE | Admit: 2022-02-07 | Discharge: 2022-02-07 | Disposition: A | Discharge status: Home / Self Care | Payer: 59 | Source: Ambulatory Visit | Attending: Infectious Diseases | Admitting: Infectious Diseases

## 2022-02-07 DIAGNOSIS — R928 Other abnormal and inconclusive findings on diagnostic imaging of breast: Secondary | ICD-10-CM | POA: Diagnosis present

## 2022-02-07 DIAGNOSIS — N63 Unspecified lump in unspecified breast: Secondary | ICD-10-CM

## 2022-03-07 ENCOUNTER — Other Ambulatory Visit: Payer: Self-pay

## 2022-03-07 ENCOUNTER — Other Ambulatory Visit (HOSPITAL_BASED_OUTPATIENT_CLINIC_OR_DEPARTMENT_OTHER): Payer: Self-pay

## 2022-03-07 ENCOUNTER — Encounter (HOSPITAL_BASED_OUTPATIENT_CLINIC_OR_DEPARTMENT_OTHER): Payer: Self-pay | Admitting: Pharmacist

## 2022-03-07 MED ORDER — LISDEXAMFETAMINE DIMESYLATE 50 MG PO CAPS
50.0000 mg | ORAL_CAPSULE | Freq: Every day | ORAL | 0 refills | Status: DC
  Filled 2022-03-07: qty 30, 30d supply, fill #0

## 2022-03-12 ENCOUNTER — Other Ambulatory Visit: Payer: Self-pay

## 2022-03-12 MED ORDER — LISDEXAMFETAMINE DIMESYLATE 50 MG PO CAPS: ORAL_CAPSULE | ORAL | 0 refills | Status: DC

## 2022-03-19 ENCOUNTER — Other Ambulatory Visit: Payer: Self-pay

## 2022-03-19 ENCOUNTER — Other Ambulatory Visit: Payer: Self-pay | Admitting: Internal Medicine

## 2022-03-19 DIAGNOSIS — N926 Irregular menstruation, unspecified: Secondary | ICD-10-CM

## 2022-03-20 LAB — BETA HCG QUANT (REF LAB): hCG Quant: <1 m[IU]/mL

## 2022-03-28 ENCOUNTER — Ambulatory Visit (INDEPENDENT_AMBULATORY_CARE_PROVIDER_SITE_OTHER): Payer: 59 | Admitting: Dermatology

## 2022-03-28 ENCOUNTER — Encounter: Payer: Self-pay | Admitting: Dermatology

## 2022-03-28 VITALS — BP 146/93

## 2022-03-28 DIAGNOSIS — L409 Psoriasis, unspecified: Secondary | ICD-10-CM | POA: Diagnosis not present

## 2022-03-28 DIAGNOSIS — D239 Other benign neoplasm of skin, unspecified: Secondary | ICD-10-CM

## 2022-03-28 DIAGNOSIS — L639 Alopecia areata, unspecified: Secondary | ICD-10-CM | POA: Diagnosis not present

## 2022-03-28 DIAGNOSIS — R61 Generalized hyperhidrosis: Secondary | ICD-10-CM | POA: Diagnosis not present

## 2022-03-28 MED ORDER — CLOBETASOL PROPIONATE 0.05 % EX OINT: 1.0000 | TOPICAL_OINTMENT | Freq: Two times a day (BID) | CUTANEOUS | 2 refills | Status: AC

## 2022-03-28 MED ORDER — BOTOX 100 UNITS IJ SOLR: 100.0000 [IU] | Freq: Once | INTRAMUSCULAR | 3 refills | Status: AC

## 2022-03-28 MED ORDER — TRIAMCINOLONE ACETONIDE 10 MG/ML IJ SUSP: 2.5000 mg | Freq: Once | INTRAMUSCULAR | Status: DC

## 2022-03-28 NOTE — Progress Notes (Signed)
New Patient Visit  Subjective  Catherine Montgomery is a 42 y.o. female who presents for the following: Excessive Sweating (Axilla, Qbrexa qod, se of qbrexa dry throat, irritating axilla, pt interested in other treatment due to se, tried Drysol in past), Alopecia (Scalp, ~3m, IL injections x 1, TMC bid,), check spots (R ant thigh, has had a while, irritating/Face, patient may be interested in removing), Seborrheic Dermatitis (Scalp, Royal oils shampoo and conditioner, Ketoconazole 2% shampoo ~2x month, Derm-Smooth FS oil prn, itchy), and Darkening of skin (Neck, yrs, has tried Hyrdroquinones in past).  The patient has spots, moles and lesions to be evaluated, some may be new or changing and the patient has concerns that these could be cancer.   The following portions of the chart were reviewed this encounter and updated as appropriate:   Tobacco  Allergies  Meds  Problems  Med Hx  Surg Hx  Fam Hx      Review of Systems:  No other skin or systemic complaints except as noted in HPI or Assessment and Plan.  Objective  Well appearing patient in no apparent distress; mood and affect are within normal limits.  A focused examination was performed including face, scalp, neck, axilla, legs.. Relevant physical exam findings are noted in the Assessment and Plan.  bil axilla Excessive sweating bil axilla  Scalp Erythemateous plaque with silver scale  R ant thigh, L medial knee Brown dimpling paps  L occipital scalp Well circumscribed patch with vellus hairs    Assessment & Plan  Hyperhidrosis bil axilla  Discussed Botox injections q3-71m,   Will submit to insurance for approval  Treatment plan started today:  Cont Qbrexa qohs as tolerated Do not continue clinical strength deodorant with Qbrexa  Plan for next visit: Plan Botox injections if approved by insurance    botulinum toxin Type A (BOTOX) 100 units SOLR injection - bil axilla Inject 100 Units into the skin once for 1  dose. For hyperhidrosis bil axilla  Psoriasis Scalp  Counseling on psoriasis and coordination of care  psoriasis is a chronic non-curable, but treatable genetic/hereditary disease that may have other systemic features affecting other organ systems such as joints (Psoriatic Arthritis). It is associated with an increased risk of inflammatory bowel disease, heart disease, non-alcoholic fatty liver disease, and depression.  Treatments include light and laser treatments; topical medications; and systemic medications including oral and injectables.   Treatment plan started today:  Recommend DHS Zinc wash, let sit 3-5 minutes and rinse out Start Clobetasol oint bid to aa scalp prn flares, avoid f/g/a D/c TMC cr Cont Fluocinolone oil qd prn flares  Intralesional injection - Scalp Location: scalp  Informed Consent: Discussed risks (infection, pain, bleeding, bruising, thinning of the skin, loss of skin pigment, lack of resolution, and recurrence of lesion) and benefits of the procedure, as well as the alternatives. Informed consent was obtained. Preparation: The area was prepared a standard fashion.  Procedure Details: An intralesional injection was performed with Kenalog 2 mg/cc. 0.3 cc in total were injected.  Total number of injections: 8  Plan: The patient was instructed on post-op care. Recommend OTC analgesia as needed for pain.   Dermatofibroma R ant thigh, L medial knee  A dermatofibroma is a benign growth possibly related to trauma, such as an insect bite, cut from shaving, or inflamed acne-type bump.  Treatment options to remove include shave or excision with resulting scar and risk of recurrence.  Since not bothersome, will observe for now.  Treatment plan started today:   IL Kenalog today  Intralesional injection - R ant thigh, L medial knee Location: R ant thigh  Informed Consent: Discussed risks (infection, pain, bleeding, bruising, thinning of the skin, loss of skin  pigment, lack of resolution, and recurrence of lesion) and benefits of the procedure, as well as the alternatives. Informed consent was obtained. Preparation: The area was prepared a standard fashion.  Procedure Details: An intralesional injection was performed with Kenalog 2 mg/cc. 0.2 cc in total were injected.  Total number of injections: 1  Plan: The patient was instructed on post-op care. Recommend OTC analgesia as needed for pain.   Alopecia areata L occipital scalp   Alopecia areata is a chronic autoimmune condition localized to the skin which affects hair follicles and causes hair loss, most commonly in the scalp.  Cause is unknown.  Can be unpredictable, difficult to treat, and may recur.  Treatments may include topical and intralesional steroids to decrease inflammation to allow for hair regrowth.  Other treatments may include narrowband ultraviolet B light treatment; topical Squairic acid immunotherapy application; topical or oral Minoxidil; antihistamines and oral Jak inhibitors.  Discussed IL injections  Start Clobetasol oint bid to aa sclap avoid f/g/a IL injection of Kenalog  Topical steroids (such as triamcinolone, fluocinolone, fluocinonide, mometasone, clobetasol, halobetasol, betamethasone, hydrocortisone) can cause thinning and lightening of the skin if they are used for too long in the same area. Your physician has selected the right strength medicine for your problem and area affected on the body. Please use your medication only as directed by your physician to prevent side effects.    Intralesional steroid injection side effects were reviewed including thinning of the skin and discoloration, such as redness, lightening or darkening.    Intralesional injection - L occipital scalp Location: scalp  Informed Consent: Discussed risks (infection, pain, bleeding, bruising, thinning of the skin, loss of skin pigment, lack of resolution, and recurrence of lesion) and  benefits of the procedure, as well as the alternatives. Informed consent was obtained. Preparation: The area was prepared a standard fashion.  Procedure Details: An intralesional injection was performed with Kenalog 2 mg/cc. 0.3 cc in total were injected.  Total number of injections: 1  Plan: The patient was instructed on post-op care. Recommend OTC analgesia as needed for pain.   clobetasol ointment (TEMOVATE) 0.05 % - L occipital scalp Apply 1 Application topically 2 (two) times daily. Bid to aa alopecia scalp and psoriasis scalp, avoid face, groin, axilla  Related Medications triamcinolone acetonide (KENALOG) 10 MG/ML injection 2.5 mg    Return in about 1 month (around 04/28/2022) for Hyperhidrosis f/u., Alopecia f/u, Psoriasis f/u.   I, Othelia Pulling, RMA, am acting as scribe for Ellard Artis, MD .

## 2022-03-28 NOTE — Patient Instructions (Addendum)
RX for BOTOX was sent to [facility]:  [phone] We will update you with the progress of the Prior Auth for your botox at your next visit  Do not use clinical strength deodorant with Qbrexa     Start Clobetasol ointment 2 times a day to affected area of scalp for alopecia and psoriasis as needed for flares, avoid face, groin, axilla Continue the Derma-Smooth FS oil to scalp as needed  Due to recent changes in healthcare laws, you may see results of your pathology and/or laboratory studies on MyChart before the doctors have had a chance to review them. We understand that in some cases there may be results that are confusing or concerning to you. Please understand that not all results are received at the same time and often the doctors may need to interpret multiple results in order to provide you with the best plan of care or course of treatment. Therefore, we ask that you please give [place] 2 business days to thoroughly review all your results before contacting the office for clarification. Should we see a critical lab result, you will be contacted sooner.   If You Need Anything After Your Visit  If you have any questions or concerns for your doctor, please call our main line at [phone] If no one answers, please leave a voicemail as directed and we will return your call as soon as possible. Messages left after 4 pm will be answered the following business day.   You may also send [place] a message via [place]. We typically respond to MyChart messages within 1-2 business days.  For prescription refills, please ask your pharmacy to contact our office. Our fax number is [phone].  If you have an urgent issue when the clinic is closed that cannot wait until the next business day, you can page your doctor at the number below.    Please note that while we do our best to be available for urgent issues outside of office hours, we are not available 24/7.   If you have an urgent issue and are  unable to reach [place], you may choose to seek medical care at your doctor's office, retail clinic, urgent care center, or emergency room.  If you have a medical emergency, please immediately call 911 or go to the emergency department. In the event of inclement weather, please call our main line at [phone] for an update on the status of any delays or closures.  Dermatology Medication Tips: Please keep the boxes that topical medications come in in order to help keep track of the instructions about where and how to use these. Pharmacies typically print the medication instructions only on the boxes and not directly on the medication tubes.   If your medication is too expensive, please contact our office at [phone] or send [place] a message through [place].   We are unable to tell what your co-pay for medications will be in   advance as this is different depending on your insurance coverage. However, we may be able to find a substitute medication at lower cost or fill out paperwork to get insurance to cover a needed medication.   If a prior authorization is required to get your medication covered by your insurance company, please allow [place] 1-2 business days to complete this process.  Drug prices often vary depending on where the prescription is filled and some pharmacies may offer cheaper prices.  The website www.goodrx.com contains coupons for medications through different pharmacies. The prices here do  not account for what the cost may be with help from insurance (it may be cheaper with your insurance), but the website can give you the price if you did not use any insurance.  - You can print the associated coupon and take it with your prescription to the pharmacy.  - You may also stop by our office during regular business hours and pick up a GoodRx coupon card.  - If you need your prescription sent electronically to a different pharmacy, notify our office through [facility] or by phone at  [phone]

## 2022-04-08 ENCOUNTER — Other Ambulatory Visit: Payer: Self-pay

## 2022-04-08 MED ORDER — BOTOX 100 UNITS IJ SOLR: 100.0000 [IU] | Freq: Once | INTRAMUSCULAR | 3 refills | Status: AC

## 2022-04-30 ENCOUNTER — Encounter: Payer: Self-pay | Admitting: Dermatology

## 2022-04-30 NOTE — Telephone Encounter (Signed)
Please check status of rx and reach out to pt with an update.  Thanks!

## 2022-05-02 ENCOUNTER — Ambulatory Visit (INDEPENDENT_AMBULATORY_CARE_PROVIDER_SITE_OTHER): Payer: 59 | Admitting: Dermatology

## 2022-05-02 VITALS — BP 123/78

## 2022-05-02 DIAGNOSIS — L750 Bromhidrosis: Secondary | ICD-10-CM

## 2022-05-02 DIAGNOSIS — L639 Alopecia areata, unspecified: Secondary | ICD-10-CM | POA: Diagnosis not present

## 2022-05-02 DIAGNOSIS — R61 Generalized hyperhidrosis: Secondary | ICD-10-CM | POA: Diagnosis not present

## 2022-05-02 DIAGNOSIS — L219 Seborrheic dermatitis, unspecified: Secondary | ICD-10-CM

## 2022-05-02 DIAGNOSIS — L409 Psoriasis, unspecified: Secondary | ICD-10-CM

## 2022-05-02 MED ORDER — QBREXZA 2.4 % EX PADS: 1.0000 | MEDICATED_PAD | Freq: Every day | CUTANEOUS | 0 refills | Status: AC

## 2022-05-02 MED ORDER — CLOBETASOL PROPIONATE 0.05 % EX SOLN: 1.0000 | Freq: Two times a day (BID) | CUTANEOUS | 2 refills | Status: AC

## 2022-05-02 NOTE — Patient Instructions (Signed)
Due to recent changes in healthcare laws, you may see results of your pathology and/or laboratory studies on MyChart before the doctors have had a chance to review them. We understand that in some cases there may be results that are confusing or concerning to you. Please understand that not all results are received at the same time and often the doctors may need to interpret multiple results in order to provide you with the best plan of care or course of treatment. Therefore, we ask that you please give us 2 business days to thoroughly review all your results before contacting the office for clarification. Should we see a critical lab result, you will be contacted sooner.   If You Need Anything After Your Visit  If you have any questions or concerns for your doctor, please call our main line at [phone] If no one answers, please leave a voicemail as directed and we will return your call as soon as possible. Messages left after 4 pm will be answered the following business day.   You may also send us a message via MyChart. We typically respond to MyChart messages within 1-2 business days.  For prescription refills, please ask your pharmacy to contact our office. Our fax number is [phone].  If you have an urgent issue when the clinic is closed that cannot wait until the next business day, you can page your doctor at the number below.    Please note that while we do our best to be available for urgent issues outside of office hours, we are not available 24/7.   If you have an urgent issue and are unable to reach us, you may choose to seek medical care at your doctor's office, retail clinic, urgent care center, or emergency room.  If you have a medical emergency, please immediately call 911 or go to the emergency department. In the event of inclement weather, please call our main line at [phone] for an update on the status of any delays or closures.  Dermatology Medication Tips: Please  keep the boxes that topical medications come in in order to help keep track of the instructions about where and how to use these. Pharmacies typically print the medication instructions only on the boxes and not directly on the medication tubes.   If your medication is too expensive, please contact our office at [phone] or send us a message through MyChart.   We are unable to tell what your co-pay for medications will be in advance as this is different depending on your insurance coverage. However, we may be able to find a substitute medication at lower cost or fill out paperwork to get insurance to cover a needed medication.   If a prior authorization is required to get your medication covered by your insurance company, please allow us 1-2 business days to complete this process.  Drug prices often vary depending on where the prescription is filled and some pharmacies may offer cheaper prices.  The website www.goodrx.com contains coupons for medications through different pharmacies. The prices here do not account for what the cost may be with help from insurance (it may be cheaper with your insurance), but the website can give you the price if you did not use any insurance.  - You can print the associated coupon and take it with your prescription to the pharmacy.  - You may also stop by our office during regular business hours and pick up a GoodRx coupon card.  - If you need your   prescription sent electronically to a different pharmacy, notify our office through [place] [facility] MyChart or by phone at [phone]     

## 2022-05-02 NOTE — Progress Notes (Signed)
Follow-Up Visit   Subjective  Catherine Montgomery is a 42 y.o. female who presents for the following: She is here for follow up of hyperhidrosis. She has been using Qbrexa pads for a long time. A prescription for Botox was sent to [facility] at her last appointment but it has not been filled yet. They are waiting on insurance information. She will need refills of Qbrexa sent to [facility] in [place]. 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 She is using Clobetasol ointment twice daily and Fluocinolone oil. She needs a refill of fluocinolone oil. 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 She is using Clobetasol ointment twice daily and she had [place] Kenalog injections at her last appointment. 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   The following portions of the chart were reviewed this encounter and updated as appropriate: medications, allergies, medical history  Review of Systems:  No other skin or systemic complaints except as noted in HPI or Assessment and Plan.  Objective  Well appearing patient in no apparent distress; mood and affect are within normal limits.  .  A focused examination was performed of the following areas: Scalp   Relevant exam findings are noted in the Assessment and Plan.    Assessment & Plan   ALOPECIA AREATA Exam: Vellous hairs  Treatment Plan: Procedure Note Intralesional Injection  Location: Left occipital  Informed Consent: Discussed risks (infection, pain, bleeding, bruising, thinning of the skin, loss of skin pigment, lack of resolution, and recurrence of lesion) and benefits of the procedure, as well as the alternatives. Informed consent was obtained. Preparation: The area was prepared a standard fashion.  Anesthesia: none  Procedure Details: An intralesional injection was performed with Kenalog 2.5 mg/cc. 0.3  cc in total were injected. NDC #: 2130-8657-84 Exp: 12/2023  Lot: [id number]  Total number of injections: 9  Plan: The patient was instructed on post-op care. Recommend OTC analgesia as needed for pain.   HYPERHIDROSIS Exam: Damp sweaty axillae  Treatment Plan: Botox injections have been approved. We are just waiting for the Botox to arrive. Once it has come in, we will call her to schedule an appointment for injections.  Continue Qbrexa pads qhs until she gets Botox injections.  BROHMHIDROSIS  Patient Education: Bromhidrosis, also known as osmidrosis or ozochrotia, is an unpleasant or offensive body odour due to any cause including  hyperhidrosis.  Bromhidrosis can affect all age groups, races, and both sexes. Bromhidrosis is more common in adults than children as sebaceous glands and apocrine sweat glands do not become active until puberty. The elderly have a different body odour from babies, pre-pubescent children, teenagers, and adults. It has a female predominance, and can be a particular issue in hot humid tropical climates. There may be a genetic predisposition, with studies suggesting axillary malodour is more common in Europeans and Africans than Asians.  What causes bromhidrosis? Body odour is normal and is primarily produced by the breakdown of sweat, sebum, and keratin by bacteria on the skin surface (skin microbiota). What is regarded as acceptable is determined by cultural influences including race and social upbringing.  Treatment Plan: Advised patient to wash underarms with a benzoyl peroxide wash daily. Samples of Cerave Foaming Cream Cleanser given today.   PSORIASIS Exam: Mild erythema behind ears but scale has improved  Well controlled  Psoriasis is a  chronic non-curable, but treatable genetic/hereditary disease that may have other systemic features affecting other organ systems such as joints (Psoriatic Arthritis). It is associated with an increased risk of  inflammatory bowel disease, heart disease, non-alcoholic fatty liver disease, and depression.  Treatments include light and laser treatments; topical medications; and systemic medications including oral and injectables.  Treatment Plan: Continue Clobetasol ointment bid to affected areas of scalp prn flares. Continue Fluocinolone oil prn flares.  SEBORRHEIC DERMATITIS Exam: Pink patches with greasy scale at scalp  Seborrheic Dermatitis is a chronic persistent rash characterized by pinkness and scaling most commonly of the mid face but also can occur on the scalp (dandruff), ears; mid chest, mid back and groin.  It tends to be exacerbated by stress and cooler weather.  People who have neurologic disease may experience new onset or exacerbation of existing seborrheic dermatitis.  The condition is not curable but treatable and can be controlled.  Treatment Plan: Start Clobetasol solution bid to scalp x 2 weeks on, 2 weeks off as needed.  Alopecia areata  Related Medications clobetasol ointment (TEMOVATE) 0.05 % Apply 1 Application topically 2 (two) times daily. Bid to aa alopecia scalp and psoriasis scalp, avoid face, groin, axilla  triamcinolone acetonide (KENALOG) 10 MG/ML injection 2.5 mg   Psoriasis  Hyperhidrosis  Bromhidrosis  Seborrheic dermatitis    Return in about 6 weeks (around 06/13/2022) for Follow up.  I, Joanie Coddington, CMA, am acting as scribe for Langston Reusing, MD .   Documentation: I have reviewed the above documentation for accuracy and completeness, and I agree with the above.  Langston Reusing, MD

## 2022-05-06 ENCOUNTER — Encounter: Payer: Self-pay | Admitting: Dermatology

## 2022-05-08 ENCOUNTER — Ambulatory Visit (INDEPENDENT_AMBULATORY_CARE_PROVIDER_SITE_OTHER): Payer: 59 | Admitting: Internal Medicine

## 2022-05-08 ENCOUNTER — Encounter: Payer: Self-pay | Admitting: Internal Medicine

## 2022-05-08 VITALS — BP 126/84 | HR 68 | Ht 68.0 in

## 2022-05-08 DIAGNOSIS — H6121 Impacted cerumen, right ear: Secondary | ICD-10-CM | POA: Diagnosis not present

## 2022-05-08 NOTE — Progress Notes (Signed)
I,Victoria T Hamilton,acting as a scribe for Gwynneth Aliment, MD.,have documented all relevant documentation on the behalf of Gwynneth Aliment, MD,as directed by  Gwynneth Aliment, MD while in the presence of Gwynneth Aliment, MD.    Subjective:     Patient ID: Catherine Montgomery , female    DOB: [birth year] , 42 y.o.   MRN: [id number]   Chief Complaint  Patient presents with   Ear Fullness    HPI  Pt presents today for ear cleaning. She thinks she has wax build up and would like to get this flushed out today. She states it is as if she is in a tunnel. No ear pain.      Past Medical History:  Diagnosis Date   Anemia    Anxiety    Bell's palsy    Lt side   Environmental allergies    Obesity (BMI 35.0-39.9 without comorbidity)    Vulvar intraepithelial neoplasia (VIN) grade 3      Family History  Problem Relation Age of Onset   Hypertension Mother    Diabetes Father    Hypertension Father    Diabetes Sister        not anymore   Diabetes Maternal Grandmother    Diabetes Maternal Grandfather    Breast cancer Paternal Grandmother        35's   Breast cancer Paternal Uncle 83   Colon cancer Paternal Uncle    Breast cancer Maternal Aunt 45     Current Outpatient Medications:    albuterol (VENTOLIN HFA) 108 (90 Base) MCG/ACT inhaler, Inhale 1-2 puffs into the lungs every 6 (six) hours as needed for wheezing or shortness of breath., Disp: 18 g, Rfl: 0   azelastine (ASTELIN) 0.1 % nasal spray, Place 1 spray into both nostrils 2 (two) times daily. Use in each nostril as directed, Disp: , Rfl:    benzonatate (TESSALON) 100 MG capsule, Take 1 capsule (100 mg total) by mouth 3 (three) times daily as needed for cough., Disp: 21 capsule, Rfl: 0   cetirizine (ZYRTEC) 10 MG chewable tablet, Chew 10 mg by mouth daily., Disp: , Rfl:    clobetasol (TEMOVATE) 0.05 % external solution, Apply 1 Application topically 2 (two) times daily. Apply to affected areas of scalp x 2 weeks as needed.,  Disp: 50 mL, Rfl: 2   clobetasol ointment (TEMOVATE) 0.05 %, Apply 1 Application topically 2 (two) times daily. Bid to aa alopecia scalp and psoriasis scalp, avoid face, groin, axilla, Disp: 30 g, Rfl: 2   Fluocinolone Acetonide Scalp 0.01 % OIL, APPLY TO SCALP TWICE A DAY AS NEEDED ITCH/SCALE, Disp: , Rfl:    fluticasone (FLONASE) 50 MCG/ACT nasal spray, Place 2 sprays into both nostrils daily., Disp: , Rfl:    Glycopyrronium Tosylate (QBREXZA) 2.4 % PADS, Apply 1 Pad topically at bedtime., Disp: 30 each, Rfl: 0   Glycopyrronium Tosylate 2.4 % PADS, Apply topically., Disp: , Rfl:    HYDROcodone bit-homatropine (HYCODAN) 5-1.5 MG/5ML syrup, Take 5 mLs by mouth every 6 (six) hours as needed for cough., Disp: 120 mL, Rfl: 0   ketoconazole (NIZORAL) 2 % shampoo, USE FOR 1-2WEEKS ALLOW TO REMAIN ON DRY SCALP FOR THEN WASH OUT FOLLOWED BY REG SHAMPOO, Disp: , Rfl: 1   lidocaine (XYLOCAINE) 2 % solution, Use as directed 15 mLs in the mouth or throat as needed for mouth pain., Disp: 100 mL, Rfl: 0   Multiple Vitamin (MULTIVITAMIN) tablet, Take 1 tablet by  mouth daily., Disp: , Rfl:    Olopatadine HCl 0.2 % SOLN, Apply 1 drop to eye daily as needed., Disp: 2.5 mL, Rfl: 2   predniSONE (STERAPRED UNI-PAK 21 TAB) 10 MG (21) TBPK tablet, Take by mouth daily. As directed, Disp: 21 tablet, Rfl: 0   Semaglutide-Weight Management (WEGOVY) 0.5 MG/0.5ML SOAJ, INJECT 0.5 MLS (0.5 MG TOTAL) SUBCUTANEOUSLY ONCE A WEEK, Disp: , Rfl:    sertraline (ZOLOFT) 50 MG tablet, Take 50 mg by mouth daily., Disp: , Rfl:   Current Facility-Administered Medications:    triamcinolone acetonide (KENALOG) 10 MG/ML injection 2.5 mg, 2.5 mg, Intradermal, Once, Terri Piedra, MD   No Known Allergies   Review of Systems  Constitutional: Negative.   Respiratory: Negative.    Cardiovascular: Negative.   Neurological: Negative.   Psychiatric/Behavioral: Negative.       Today's Vitals   05/08/22 1231  BP: 126/84  Pulse:  68  SpO2: 98%  Height: 5\' 8"  (1.727 m)   Body mass index is 33.45 kg/m.   Objective:  Physical Exam Vitals and nursing note reviewed.  Constitutional:      Appearance: Normal appearance.  HENT:     Head: Normocephalic and atraumatic.     Right Ear: Ear canal and external ear normal. There is impacted cerumen.     Left Ear: Tympanic membrane, ear canal and external ear normal. There is no impacted cerumen.  Eyes:     Extraocular Movements: Extraocular movements intact.  Cardiovascular:     Rate and Rhythm: Normal rate and regular rhythm.     Heart sounds: Normal heart sounds.  Pulmonary:     Effort: Pulmonary effort is normal.     Breath sounds: Normal breath sounds.  Musculoskeletal:     Cervical back: Normal range of motion.  Skin:    General: Skin is warm.  Neurological:     General: No focal deficit present.     Mental Status: She is alert.  Psychiatric:        Mood and Affect: Mood normal.        Behavior: Behavior normal.         Assessment And Plan:     1. Impacted cerumen of right ear Comments: After obtaining verbal consent, right ear was flushed by irrigation w/o complication. No TM abnormality was noted. - Ear Lavage     Patient was given opportunity to ask questions. Patient verbalized understanding of the plan and was able to repeat key elements of the plan. All questions were answered to their satisfaction.   I, Gwynneth Aliment, MD, have reviewed all documentation for this visit. The documentation on 05/08/22 for the exam, diagnosis, procedures, and orders are all accurate and complete.   IF YOU HAVE BEEN REFERRED TO A SPECIALIST, IT MAY TAKE 1-2 WEEKS TO SCHEDULE/PROCESS THE REFERRAL. IF YOU HAVE NOT HEARD FROM US/SPECIALIST IN TWO WEEKS, PLEASE GIVE [place] A CALL AT [phone].   THE PATIENT IS ENCOURAGED TO PRACTICE SOCIAL DISTANCING DUE TO THE COVID-19 PANDEMIC.

## 2022-05-14 ENCOUNTER — Telehealth: Payer: Self-pay | Admitting: Dermatology

## 2022-05-14 NOTE — Telephone Encounter (Signed)
Patient called in to schedule follow up after receiving her medication, appointment scheduled. Per Dr. Onalee Hua, scheduling needed to ask patient if she would like the numbing cream to which the patient advised that she did.

## 2022-05-14 NOTE — Telephone Encounter (Signed)
Hi Catherine Montgomery,  It looks like the pt called and Mon helped schedule her botox injection appointment.  Can you please send an order for topical lidocaine cream to be applied 1 hour before her appointment.   Thanks!

## 2022-05-15 ENCOUNTER — Other Ambulatory Visit: Payer: Self-pay

## 2022-05-15 MED ORDER — LIDOCAINE 5 % EX OINT: 1.0000 | TOPICAL_OINTMENT | CUTANEOUS | 0 refills | Status: AC | PRN

## 2022-05-20 ENCOUNTER — Ambulatory Visit (INDEPENDENT_AMBULATORY_CARE_PROVIDER_SITE_OTHER): Payer: 59 | Admitting: Dermatology

## 2022-05-20 ENCOUNTER — Encounter: Payer: Self-pay | Admitting: Dermatology

## 2022-05-20 DIAGNOSIS — R61 Generalized hyperhidrosis: Secondary | ICD-10-CM | POA: Diagnosis not present

## 2022-05-20 MED ORDER — CLOBETASOL PROPIONATE 0.05 % EX OINT: 1.0000 | TOPICAL_OINTMENT | Freq: Two times a day (BID) | CUTANEOUS | 0 refills | Status: AC

## 2022-05-20 MED ORDER — ONABOTULINUMTOXINA 100 UNITS IJ SOLR
100.0000 [IU] | Freq: Once | INTRAMUSCULAR | Status: AC
  Administered 2022-05-20: 100 [IU] via INTRADERMAL

## 2022-05-20 NOTE — Progress Notes (Signed)
   Follow-Up Visit   Subjective  Catherine Montgomery is a 42 y.o. female who presents for the following: Botox injections for hyperhidrosis    The following portions of the chart were reviewed this encounter and updated as appropriate: medications, allergies, medical history  Review of Systems:  No other skin or systemic complaints except as noted in HPI or Assessment and Plan.  Objective  Well appearing patient in no apparent distress; mood and affect are within normal limits.  A focused examination was performed of the following areas: Bilateral Axillae   Relevant exam findings are noted in the Assessment and Plan.  Left Axilla, Right Axilla Damp warm skin with pooling / beading of sweat    Assessment & Plan     Hyperhidrosis (2) Left Axilla; Right Axilla  Intralesional injection - Left Axilla, Right Axilla Location/Units: 50 units left axillae Location/Units: 50 Units right axillae  Extended discussion regarding potential benefits of Botox treatment. Risks, complications and limitations, included but not limited to pain, bleeding, bruising, variable results, unwanted muscle relaxation of adjacent muscles (especially of the fine motor control of the hands, if hands are injected), paradoxical sweating and need for repeat treatment discussed.   This procedure has been fully reviewed with the patient. The patient has consented to the treatment and has signed the verification of informed consent.  Following consent, subcutaneous Botox at level of eccrine glands performed according to standard protocol.   The patient tolerated the procedure well and left the office in good condition.  Related Medications botulinum toxin Type A (BOTOX) injection 100 Units    Location: Bilateral Axillae   Informed consent: Discussed risks (infection, pain, bleeding, bruising, swelling, allergic reaction, paralysis of nearby muscles, eyelid droop, double vision, neck weakness, difficulty  breathing, headache, undesirable cosmetic result, and need for additional treatment) and benefits of the procedure, as well as the alternatives.  Informed consent was obtained.  Preparation: The area was cleansed with alcohol.  Procedure Details:  Botox was injected into the dermis with a 30-gauge needle. Pressure applied to any bleeding. Ice packs offered for swelling.  Lot Number:  [id number] Expiration:  04/2024  Total Units Injected:  20 Patient tolerated well  IRRITANT CONTACT DERMATITIS Exam: Scaly pink papules and/or plaques  Treatment Plan: Clobetasol ointment to use twice a day for two weeks   Return in 3 months (on 08/20/2022) for Botox injections.    Documentation: I have reviewed the above documentation for accuracy and completeness, and I agree with the above.  Langston Reusing, DO  I, Germaine Pomfret, CMA, am acting as scribe for [employer], DO.

## 2022-05-20 NOTE — Patient Instructions (Addendum)
Due to recent changes in healthcare laws, you may see results of your pathology and/or laboratory studies on MyChart before the doctors have had a chance to review them. We understand that in some cases there may be results that are confusing or concerning to you. Please understand that not all results are received at the same time and often the doctors may need to interpret multiple results in order to provide you with the best plan of care or course of treatment. Therefore, we ask that you please give us 2 business days to thoroughly review all your results before contacting the office for clarification. Should we see a critical lab result, you will be contacted sooner.   If You Need Anything After Your Visit  If you have any questions or concerns for your doctor, please call our main line at [phone] If no one answers, please leave a voicemail as directed and we will return your call as soon as possible. Messages left after 4 pm will be answered the following business day.   You may also send us a message via MyChart. We typically respond to MyChart messages within 1-2 business days.  For prescription refills, please ask your pharmacy to contact our office. Our fax number is [phone].  If you have an urgent issue when the clinic is closed that cannot wait until the next business day, you can page your doctor at the number below.    Please note that while we do our best to be available for urgent issues outside of office hours, we are not available 24/7.   If you have an urgent issue and are unable to reach us, you may choose to seek medical care at your doctor's office, retail clinic, urgent care center, or emergency room.  If you have a medical emergency, please immediately call 911 or go to the emergency department. In the event of inclement weather, please call our main line at [phone] for an update on the status of any delays or closures.  Dermatology Medication Tips: Please  keep the boxes that topical medications come in in order to help keep track of the instructions about where and how to use these. Pharmacies typically print the medication instructions only on the boxes and not directly on the medication tubes.   If your medication is too expensive, please contact our office at [phone] or send us a message through MyChart.   We are unable to tell what your co-pay for medications will be in advance as this is different depending on your insurance coverage. However, we may be able to find a substitute medication at lower cost or fill out paperwork to get insurance to cover a needed medication.   If a prior authorization is required to get your medication covered by your insurance company, please allow us 1-2 business days to complete this process.  Drug prices often vary depending on where the prescription is filled and some pharmacies may offer cheaper prices.  The website www.goodrx.com contains coupons for medications through different pharmacies. The prices here do not account for what the cost may be with help from insurance (it may be cheaper with your insurance), but the website can give you the price if you did not use any insurance.  - You can print the associated coupon and take it with your prescription to the pharmacy.  - You may also stop by our office during regular business hours and pick up a GoodRx coupon card.  - If you need your   prescription sent electronically to a different pharmacy, notify our office through [place] MyChart or by phone at [phone]     

## 2022-05-22 ENCOUNTER — Ambulatory Visit (INDEPENDENT_AMBULATORY_CARE_PROVIDER_SITE_OTHER): Payer: 59 | Admitting: Nurse Practitioner

## 2022-05-22 VITALS — BP 120/78 | HR 91 | Temp 98.5°F | Ht 68.0 in | Wt 220.0 lb

## 2022-05-22 DIAGNOSIS — R6883 Chills (without fever): Secondary | ICD-10-CM

## 2022-05-22 DIAGNOSIS — R110 Nausea: Secondary | ICD-10-CM | POA: Diagnosis not present

## 2022-05-22 DIAGNOSIS — R519 Headache, unspecified: Secondary | ICD-10-CM

## 2022-05-22 LAB — POC SOFIA 2 FLU + SARS ANTIGEN FIA

## 2022-05-22 LAB — POCT RAPID STREP A (OFFICE): Rapid Strep A Screen: NEGATIVE

## 2022-05-22 MED ORDER — ONDANSETRON HCL 4 MG PO TABS: 4.0000 mg | ORAL_TABLET | Freq: Every day | ORAL | 1 refills | Status: AC | PRN

## 2022-05-22 NOTE — Progress Notes (Signed)
Hershal Coria Martin,acting as a Neurosurgeon for Arnette Felts, FNP.,have documented all relevant documentation on the behalf of Arnette Felts, FNP,as directed by  Arnette Felts, FNP while in the presence of Arnette Felts, FNP.    Subjective:     Patient ID: Catherine Montgomery , female    DOB: 12/19/1980 , 42 y.o.   MRN: [id number]   Chief Complaint  Patient presents with   Nausea   Headache   Chills    HPI  Patient presents today for nausea, headache, chills starting last night. Patient reports starting to feel bad last night.  BP Readings from Last 3 Encounters: 05/22/22 : 120/78 05/08/22 : 126/84 05/02/22 : 123/78    Emesis  This is a new problem. The current episode started yesterday. The problem occurs less than 2 times per day. The problem has been gradually worsening. There has been no fever. Associated symptoms include chills. Pertinent negatives include no abdominal pain, diarrhea or fever. She has tried nothing for the symptoms.     Past Medical History:  Diagnosis Date   Anemia    Anxiety    Bell's palsy    Lt side   Environmental allergies    Obesity (BMI 35.0-39.9 without comorbidity)    Vulvar intraepithelial neoplasia (VIN) grade 3      Family History  Problem Relation Age of Onset   Hypertension Mother    Diabetes Father    Hypertension Father    Diabetes Sister        not anymore   Diabetes Maternal Grandmother    Diabetes Maternal Grandfather    Breast cancer Paternal Grandmother        28's   Breast cancer Paternal Uncle 74   Colon cancer Paternal Uncle    Breast cancer Maternal Aunt 45     Current Outpatient Medications:    ondansetron (ZOFRAN) 4 MG tablet, Take 1 tablet (4 mg total) by mouth daily as needed for nausea or vomiting., Disp: 30 tablet, Rfl: 1   albuterol (VENTOLIN HFA) 108 (90 Base) MCG/ACT inhaler, Inhale 1-2 puffs into the lungs every 6 (six) hours as needed for wheezing or shortness of breath., Disp: 18 g, Rfl: 0   azelastine  (ASTELIN) 0.1 % nasal spray, Place 1 spray into both nostrils 2 (two) times daily. Use in each nostril as directed, Disp: , Rfl:    cetirizine (ZYRTEC) 10 MG chewable tablet, Chew 10 mg by mouth daily., Disp: , Rfl:    clobetasol (TEMOVATE) 0.05 % external solution, Apply 1 Application topically 2 (two) times daily. Apply to affected areas of scalp x 2 weeks as needed., Disp: 50 mL, Rfl: 2   clobetasol ointment (TEMOVATE) 0.05 %, Apply 1 Application topically 2 (two) times daily. Bid to aa alopecia scalp and psoriasis scalp, avoid face, groin, axilla, Disp: 30 g, Rfl: 2   clobetasol ointment (TEMOVATE) 0.05 %, Apply 1 Application topically 2 (two) times daily. Apply twice a day for up to two weeks, Disp: 30 g, Rfl: 0   Fluocinolone Acetonide Scalp 0.01 % OIL, APPLY TO SCALP TWICE A DAY AS NEEDED ITCH/SCALE, Disp: , Rfl:    fluticasone (FLONASE) 50 MCG/ACT nasal spray, Place 2 sprays into both nostrils daily., Disp: , Rfl:    Glycopyrronium Tosylate (QBREXZA) 2.4 % PADS, Apply 1 Pad topically at bedtime., Disp: 30 each, Rfl: 0   Glycopyrronium Tosylate 2.4 % PADS, Apply topically., Disp: , Rfl:    ketoconazole (NIZORAL) 2 % shampoo, USE FOR 1-2WEEKS ALLOW  TO REMAIN ON DRY SCALP FOR THEN WASH OUT FOLLOWED BY REG SHAMPOO, Disp: , Rfl: 1   lidocaine (XYLOCAINE) 2 % solution, Use as directed 15 mLs in the mouth or throat as needed for mouth pain., Disp: 100 mL, Rfl: 0   lidocaine (XYLOCAINE) 5 % ointment, Apply 1 Application topically as needed. Apply to areas being treated one hour prior to procedure, Disp: 35.44 g, Rfl: 0   Multiple Vitamin (MULTIVITAMIN) tablet, Take 1 tablet by mouth daily., Disp: , Rfl:    Olopatadine HCl 0.2 % SOLN, Apply 1 drop to eye daily as needed., Disp: 2.5 mL, Rfl: 2   Semaglutide-Weight Management (WEGOVY) 0.5 MG/0.5ML SOAJ, INJECT 0.5 MLS (0.5 MG TOTAL) SUBCUTANEOUSLY ONCE A WEEK, Disp: , Rfl:    sertraline (ZOLOFT) 50 MG tablet, Take 50 mg by mouth daily., Disp: ,  Rfl:   Current Facility-Administered Medications:    triamcinolone acetonide (KENALOG) 10 MG/ML injection 2.5 mg, 2.5 mg, Intradermal, Once, Terri Piedra, DO   No Known Allergies   Review of Systems  Constitutional:  Positive for chills. Negative for fever.  Gastrointestinal:  Positive for vomiting. Negative for abdominal pain and diarrhea.     Today's Vitals   05/22/22 1457  BP: 120/78  Pulse: 91  Temp: 98.5 F (36.9 C)  TempSrc: Oral  Weight: 220 lb (99.8 kg)  Height: 5\' 8"  (1.727 m)  PainSc: 0-No pain   Body mass index is 33.45 kg/m.  Wt Readings from Last 3 Encounters:  05/22/22 220 lb (99.8 kg)  05/10/20 220 lb (99.8 kg)  03/07/20 235 lb (106.6 kg)     The ASCVD Risk score (Arnett DK, et al., 2019) failed to calculate for the following reasons:   Cannot find a previous HDL lab   Cannot find a previous total cholesterol lab ++ Objective:  Physical Exam Vitals reviewed.  Constitutional:      General: She is not in acute distress.    Appearance: Normal appearance. She is obese.  Cardiovascular:     Rate and Rhythm: Normal rate and regular rhythm.     Pulses: Normal pulses.     Heart sounds: Normal heart sounds. No murmur heard. Pulmonary:     Effort: Pulmonary effort is normal. No respiratory distress.     Breath sounds: Normal breath sounds. No wheezing.  Skin:    Capillary Refill: Capillary refill takes less than 2 seconds.  Neurological:     General: No focal deficit present.     Mental Status: She is alert and oriented to person, place, and time.     Cranial Nerves: No cranial nerve deficit.  Psychiatric:        Mood and Affect: Mood normal.        Behavior: Behavior normal.        Thought Content: Thought content normal.        Judgment: Judgment normal.         Assessment And Plan:     1. Nausea Comments: Will treat with Zofran - POCT rapid strep A - POC SOFIA 2 FLU + SARS ANTIGEN FIA  2. Acute intractable headache, unspecified  headache type Comments: Encouraged to get some rest and stay well-hydrated.  May take Tylenol for headache - POCT rapid strep A - POC SOFIA 2 FLU + SARS ANTIGEN FIA  3. Chills (without fever) Comments: Rapid COVID is negative.  Rapid strep is negative - POCT rapid strep A - POC SOFIA 2 FLU + SARS ANTIGEN  FIA    No follow-ups on file.  Patient was given opportunity to ask questions. Patient verbalized understanding of the plan and was able to repeat key elements of the plan. All questions were answered to their satisfaction.  Arnette Felts, FNP   I, Arnette Felts, FNP, have reviewed all documentation for this visit. The documentation on 05/22/22 for the exam, diagnosis, procedures, and orders are all accurate and complete.   IF YOU HAVE BEEN REFERRED TO A SPECIALIST, IT MAY TAKE 1-2 WEEKS TO SCHEDULE/PROCESS THE REFERRAL. IF YOU HAVE NOT HEARD FROM US/SPECIALIST IN TWO WEEKS, PLEASE GIVE [place] A CALL AT [phone].   THE PATIENT IS ENCOURAGED TO PRACTICE SOCIAL DISTANCING DUE TO THE COVID-19 PANDEMIC.

## 2022-05-29 ENCOUNTER — Encounter: Payer: Self-pay | Admitting: Nurse Practitioner

## 2022-06-12 ENCOUNTER — Encounter: Payer: Self-pay | Admitting: Dermatology

## 2022-06-17 ENCOUNTER — Ambulatory Visit: Payer: 59 | Admitting: Dermatology

## 2022-06-20 ENCOUNTER — Ambulatory Visit (INDEPENDENT_AMBULATORY_CARE_PROVIDER_SITE_OTHER): Payer: 59 | Admitting: Dermatology

## 2022-06-20 DIAGNOSIS — L639 Alopecia areata, unspecified: Secondary | ICD-10-CM

## 2022-06-20 MED ORDER — TRIAMCINOLONE ACETONIDE 10 MG/ML IJ SUSP
10.0000 mg | Freq: Once | INTRAMUSCULAR | Status: AC
  Administered 2022-06-20: 10 mg

## 2022-06-20 NOTE — Progress Notes (Signed)
   Follow-Up Visit   Subjective  Catherine Montgomery is a 42 y.o. female who presents for the following: Alopecia Areata   Patient present today for follow up visit for Hyperhidrosis. Patient was last evaluated on 05/20/22. Patient reports better are resolved. Patient reports no medication changes.   The following portions of the chart were reviewed this encounter and updated as appropriate: medications, allergies, medical history  Review of Systems:  No other skin or systemic complaints except as noted in HPI or Assessment and Plan.  Objective  Well appearing patient in no apparent distress; mood and affect are within normal limits.  A focused examination was performed of the following areas: Nape   Relevant exam findings are noted in the Assessment and Plan.    Assessment & Plan    Alopecia Areata Exam: Diffuse thinning of the crown and widening of the midline part with retention of the frontal hairline  Better but not at goal  Alopecia Areata is a chronic condition related to genetics and/or hormonal changes.   Treatment Plan: -8 Areas injected with Kenalog-10, patient tolerated well     No follow-ups on file.   Documentation: I have reviewed the above documentation for accuracy and completeness, and I agree with the above.  Stasia Cavalier, am acting as scribe for Langston Reusing, DO.  Langston Reusing, DO

## 2022-06-20 NOTE — Patient Instructions (Signed)
Due to recent changes in healthcare laws, you may see results of your pathology and/or laboratory studies on MyChart before the doctors have had a chance to review them. We understand that in some cases there may be results that are confusing or concerning to you. Please understand that not all results are received at the same time and often the doctors may need to interpret multiple results in order to provide you with the best plan of care or course of treatment. Therefore, we ask that you please give us 2 business days to thoroughly review all your results before contacting the office for clarification. Should we see a critical lab result, you will be contacted sooner.   If You Need Anything After Your Visit  If you have any questions or concerns for your doctor, please call our main line at [phone] If no one answers, please leave a voicemail as directed and we will return your call as soon as possible. Messages left after 4 pm will be answered the following business day.   You may also send us a message via MyChart. We typically respond to MyChart messages within 1-2 business days.  For prescription refills, please ask your pharmacy to contact our office. Our fax number is [phone].  If you have an urgent issue when the clinic is closed that cannot wait until the next business day, you can page your doctor at the number below.    Please note that while we do our best to be available for urgent issues outside of office hours, we are not available 24/7.   If you have an urgent issue and are unable to reach us, you may choose to seek medical care at your doctor's office, retail clinic, urgent care center, or emergency room.  If you have a medical emergency, please immediately call 911 or go to the emergency department. In the event of inclement weather, please call our main line at [phone] for an update on the status of any delays or closures.  Dermatology Medication Tips: Please  keep the boxes that topical medications come in in order to help keep track of the instructions about where and how to use these. Pharmacies typically print the medication instructions only on the boxes and not directly on the medication tubes.   If your medication is too expensive, please contact our office at [phone] or send us a message through MyChart.   We are unable to tell what your co-pay for medications will be in advance as this is different depending on your insurance coverage. However, we may be able to find a substitute medication at lower cost or fill out paperwork to get insurance to cover a needed medication.   If a prior authorization is required to get your medication covered by your insurance company, please allow us 1-2 business days to complete this process.  Drug prices often vary depending on where the prescription is filled and some pharmacies may offer cheaper prices.  The website www.goodrx.com contains coupons for medications through different pharmacies. The prices here do not account for what the cost may be with help from insurance (it may be cheaper with your insurance), but the website can give you the price if you did not use any insurance.  - You can print the associated coupon and take it with your prescription to the pharmacy.  - You may also stop by our office during regular business hours and pick up a GoodRx coupon card.  - If you need your   prescription sent electronically to a different pharmacy, notify our office through [facility] MyChart or by phone at [phone]     

## 2022-08-14 ENCOUNTER — Encounter: Payer: Self-pay | Admitting: Dermatology

## 2022-08-14 ENCOUNTER — Ambulatory Visit: Payer: 59 | Admitting: Dermatology

## 2022-08-14 VITALS — BP 119/77 | HR 95

## 2022-08-14 DIAGNOSIS — L74519 Primary focal hyperhidrosis, unspecified: Secondary | ICD-10-CM | POA: Diagnosis not present

## 2022-08-14 NOTE — Patient Instructions (Addendum)
Due to recent changes in healthcare laws, you may see results of your pathology and/or laboratory studies on MyChart before the doctors have had a chance to review them. We understand that in some cases there may be results that are confusing or concerning to you. Please understand that not all results are received at the same time and often the doctors may need to interpret multiple results in order to provide you with the best plan of care or course of treatment. Therefore, we ask that you please give us 2 business days to thoroughly review all your results before contacting the office for clarification. Should we see a critical lab result, you will be contacted sooner.   If You Need Anything After Your Visit  If you have any questions or concerns for your doctor, please call our main line at [phone] If no one answers, please leave a voicemail as directed and we will return your call as soon as possible. Messages left after 4 pm will be answered the following business day.   You may also send us a message via MyChart. We typically respond to MyChart messages within 1-2 business days.  For prescription refills, please ask your pharmacy to contact our office. Our fax number is [phone].  If you have an urgent issue when the clinic is closed that cannot wait until the next business day, you can page your doctor at the number below.    Please note that while we do our best to be available for urgent issues outside of office hours, we are not available 24/7.   If you have an urgent issue and are unable to reach us, you may choose to seek medical care at your doctor's office, retail clinic, urgent care center, or emergency room.  If you have a medical emergency, please immediately call 911 or go to the emergency department. In the event of inclement weather, please call our main line at [phone] for an update on the status of any delays or closures.  Dermatology Medication Tips: Please  keep the boxes that topical medications come in in order to help keep track of the instructions about where and how to use these. Pharmacies typically print the medication instructions only on the boxes and not directly on the medication tubes.   If your medication is too expensive, please contact our office at [phone] or send us a message through MyChart.   We are unable to tell what your co-pay for medications will be in advance as this is different depending on your insurance coverage. However, we may be able to find a substitute medication at lower cost or fill out paperwork to get insurance to cover a needed medication.   If a prior authorization is required to get your medication covered by your insurance company, please allow us 1-2 business days to complete this process.  Drug prices often vary depending on where the prescription is filled and some pharmacies may offer cheaper prices.  The website www.goodrx.com contains coupons for medications through different pharmacies. The prices here do not account for what the cost may be with help from insurance (it may be cheaper with your insurance), but the website can give you the price if you did not use any insurance.  - You can print the associated coupon and take it with your prescription to the pharmacy.  - You may also stop by our office during regular business hours and pick up a GoodRx coupon card.  - If you need your   prescription sent electronically to a different pharmacy, notify our office through [facility] MyChart or by phone at [phone]     

## 2022-08-14 NOTE — Progress Notes (Signed)
   Follow-Up Visit   Subjective  Catherine Montgomery is a 42 y.o. female who presents for the following: hyperhidrosis   The following portions of the chart were reviewed this encounter and updated as appropriate: medications, allergies, medical history  Review of Systems:  No other skin or systemic complaints except as noted in HPI or Assessment and Plan.  Objective  Well appearing patient in no apparent distress; mood and affect are within normal limits.   A focused examination was performed of the following areas: Bilateral axilla  Relevant exam findings are noted in the Assessment and Plan.    Assessment & Plan     Primary focal hyperhidrosis bilateral Axilla  Intralesional injection - bilateral Axilla Location: See attached image  Informed consent: Discussed risks (infection, pain, bleeding, bruising, swelling, allergic reaction, paralysis of nearby muscles, eyelid droop, double vision, neck weakness, difficulty breathing, headache, undesirable cosmetic result, and need for additional treatment) and benefits of the procedure, as well as the alternatives.  Informed consent was obtained.  Preparation: The area was cleansed with alcohol.  Procedure Details:  Botox was injected into the dermis with a 30-gauge needle. Pressure applied to any bleeding. Ice packs offered for swelling.  Lot Number:  [id number] Expiration:  04/2024  Total Units Injected:  4  Plan: Discussed post procedure care and expectations.  Patient was instructed to avoid massaging the face and avoid vigorous exercise for the rest of the day. Tylenol may be used for headache.  Allow 2 weeks before returning to clinic for additional dosing as needed. Patient will call for any problems.     No follow-ups on file.  Owens Shark, CMA, am acting as scribe for [employer], DO.   Documentation: I have reviewed the above documentation for accuracy and completeness, and I agree with the above.  Langston Reusing, DO

## 2022-11-08 ENCOUNTER — Encounter: Payer: Self-pay | Admitting: Dermatology

## 2022-11-14 ENCOUNTER — Ambulatory Visit: Payer: 59 | Admitting: Dermatology

## 2022-11-14 DIAGNOSIS — L74510 Primary focal hyperhidrosis, axilla: Secondary | ICD-10-CM | POA: Diagnosis not present

## 2022-11-14 DIAGNOSIS — R61 Generalized hyperhidrosis: Secondary | ICD-10-CM

## 2022-11-14 DIAGNOSIS — L74519 Primary focal hyperhidrosis, unspecified: Secondary | ICD-10-CM

## 2022-11-14 MED ORDER — ONABOTULINUMTOXINA 100 UNITS IJ SOLR
100.0000 [IU] | Freq: Once | INTRAMUSCULAR | Status: AC
  Administered 2022-11-14: 100 [IU] via INTRADERMAL

## 2022-11-14 NOTE — Progress Notes (Signed)
   Follow-Up Visit   Subjective  Catherine Montgomery is a 42 y.o. female who presents for the following: Hyperhidrosis  Patient present today for follow up visit for Hyperhidrosis. Patient was last evaluated on 08/14/22. Patient reports sxs are  improving . Patient denies medication changes.  The following portions of the chart were reviewed this encounter and updated as appropriate: medications, allergies, medical history  Review of Systems:  No other skin or systemic complaints except as noted in HPI or Assessment and Plan.  Objective  Well appearing patient in no apparent distress; mood and affect are within normal limits.  A focused examination was performed of the following areas: B/L Axilla  Relevant exam findings are noted in the Assessment and Plan.  Left Axilla, Right Axilla Pooling of sweat in bilateral axillae    Assessment & Plan    PROCEDURE NOTE Hyperhidrosis  Related Medications botulinum toxin Type A (BOTOX) injection 100 Units   Primary focal hyperhidrosis (2) Left Axilla; Right Axilla  Intralesional injection - Left Axilla, Right Axilla Location/Units: 50 units right axillae Location/Units: 50 units left axillae  Extended discussion regarding potential benefits of Botox treatment. Risks, complications and limitations, included but not limited to pain, bleeding, bruising, variable results, unwanted muscle relaxation of adjacent muscles (especially of the fine motor control of the hands, if hands are injected), paradoxical sweating and need for repeat treatment discussed.   This procedure has been fully reviewed with the patient. The patient has consented to the treatment and has signed the verification of informed consent.  Following consent, subcutaneous Botox at level of eccrine glands performed according to standard protocol.   The patient tolerated the procedure well and left the office in good condition.    No follow-ups on  file.    Documentation: I have reviewed the above documentation for accuracy and completeness, and I agree with the above.  Stasia Cavalier, am acting as scribe for Langston Reusing, DO.  Langston Reusing, DO

## 2022-11-14 NOTE — Patient Instructions (Signed)

## 2023-01-07 DIAGNOSIS — F909 Attention-deficit hyperactivity disorder, unspecified type: Secondary | ICD-10-CM | POA: Insufficient documentation

## 2023-01-07 DIAGNOSIS — R61 Generalized hyperhidrosis: Secondary | ICD-10-CM | POA: Insufficient documentation

## 2023-03-20 ENCOUNTER — Encounter: Payer: Self-pay | Admitting: Dermatology

## 2023-03-31 ENCOUNTER — Other Ambulatory Visit: Payer: Self-pay

## 2023-03-31 MED ORDER — BOTOX 100 UNITS IJ SOLR: 100.0000 [IU] | Freq: Once | INTRAMUSCULAR | 0 refills | Status: AC

## 2023-05-19 ENCOUNTER — Ambulatory Visit: Admitting: Dermatology

## 2023-05-19 VITALS — BP 138/72

## 2023-05-19 DIAGNOSIS — B079 Viral wart, unspecified: Secondary | ICD-10-CM

## 2023-05-19 DIAGNOSIS — R61 Generalized hyperhidrosis: Secondary | ICD-10-CM

## 2023-05-19 DIAGNOSIS — L219 Seborrheic dermatitis, unspecified: Secondary | ICD-10-CM | POA: Diagnosis not present

## 2023-05-19 DIAGNOSIS — L639 Alopecia areata, unspecified: Secondary | ICD-10-CM

## 2023-05-19 MED ORDER — ZORYVE 0.3 % EX FOAM
1.0000 | Freq: Every day | CUTANEOUS | 3 refills | Status: DC
Start: 2023-05-19 — End: 2023-09-22

## 2023-05-19 NOTE — Progress Notes (Signed)
   Follow-Up Visit   Subjective  Catherine Montgomery is a 43 y.o. female who presents for the following: dermatitis of scalp  Patient present today for follow up visit for dermatitis of scalp. Patient was last evaluated on 11/14/2022. Patient reports sxs are improved but not at goal . Patient denies medication changes. Patient reports that she used the clobetasol  solution about 2-3 weeks ago.  The following portions of the chart were reviewed this encounter and updated as appropriate: medications, allergies, medical history  Review of Systems:  No other skin or systemic complaints except as noted in HPI or Assessment and Plan.  Objective  Well appearing patient in no apparent distress; mood and affect are within normal limits.    A focused examination was performed of the following areas:   Relevant exam findings are noted in the Assessment and Plan.    Assessment & Plan    . Seborrheic dermatitis - Assessment: Patient has active seborrheic dermatitis of the scalp, presenting with flaking. Previously treated with clobetasol , which was effective for both dermatitis and alopecia areata. Patient has also tried ketoconazole shampoo and Dermasmoothe oil in the past. Currently using Royal Oils shampoo and conditioner.'  - Plan:    Prescribe Zoryve foam for daily use     - Send prescription to [facility] with diagnosis and prior treatment failures noted     - Anticipate cost of approximately $35     - Provide 5-6 refills     - Instruct patient to shake well and apply carefully    Recommend over-the-counter zinc and selenium sulfide shampoo mixture    Provide Vichy shampoo sample to mix with current shampoo    If Zoryve is too expensive, consider switching back to clobetasol   2. Alopecia areata - Assessment: Patient has a history of alopecia areata, previously treated with clobetasol . Based on patient's report, the condition appears to have improved with hair regrowth. - Plan:     Discontinue clobetasol  for alopecia areata as hair has regrown    Monitor for any recurrence  3. Hyperhidrosis - Assessment: Patient reports ongoing issues with sweating. Currently using Qbrexza  2-3 times per week at night, which has been effective. Patient experienced side effects of dryness and coughing with daily use. Previous Botox  treatment is pending insurance appeal.  - Plan:    Continue Qbrexza  2-3 times per week as currently used    Advise patient can cut towelette in half to extend supply if needed    Monitor Botox  insurance appeal, results expected in 1-2 weeks    Inform patient of potential future option for out-of-pocket Botox  treatment (approximately $800, three times per year) if cosmetic services become available    Consider compounded glycopyrronium cream as alternative if insurance issues arise VIRAL WARTS, UNSPECIFIED TYPE (2) Scalp (2) Destruction of lesion - Scalp (2) Complexity: simple   Destruction method: cryotherapy   Informed consent: discussed and consent obtained   Timeout:  patient name, date of birth, surgical site, and procedure verified Lesion destroyed using liquid nitrogen: Yes   Region frozen until ice ball extended beyond lesion: Yes   Outcome: patient tolerated procedure well with no complications   Post-procedure details: wound care instructions given    Return in about 4 months (around 09/19/2023).  Exie Holler, CMA, am acting as scribe for [employer], DO.   Documentation: I have reviewed the above documentation for accuracy and completeness, and I agree with the above.  Louana Roup, DO

## 2023-05-19 NOTE — Patient Instructions (Addendum)
 Hello Catherine Montgomery,  Thank you for visiting today. Here is a summary of the key instructions:  - Medications: Use Zoryve foam daily for seborrheic dermatitis in scalp (ok to use on face too for flares)   - Shake well before use   - Apply carefully to affected areas   - Prescription sent to [facility]   - If too expensive, contact office to switch back to clobetasol   - Skin Care:   - Try mixing Vichy shampoo sample with your current Royal Oils shampoo   - Consider buying zinc or selenium sulfide shampoo over-the-counter at [employer] or [employer]  - Hyperhidrosis Management:   - Continue using Qbrexza  2-3 times per week at night   - Can cut towelette in half to make it last longer   - Contact office if you need more Qbrexza   - Follow-up: Wait for Botox  appeal results (expected in 1-2 weeks)  Please reach out if you have any questions or concerns.  Warm regards,  Dr. Louana Roup, Dermatology    Your prescription was sent to [facility] in [place]. A representative from [facility] will contact you within 3 business hours to verify your address and insurance information to schedule a free delivery. If for any reason you do not receive a phone call from them, please reach out to them. Their phone number is [phone] and their hours are Monday-Friday 9:00 am-5:00 pm.     Important Information  Due to recent changes in healthcare laws, you may see results of your pathology and/or laboratory studies on MyChart before the doctors have had a chance to review them. We understand that in some cases there may be results that are confusing or concerning to you. Please understand that not all results are received at the same time and often the doctors may need to interpret multiple results in order to provide you with the best plan of care or course of treatment. Therefore, we ask that you please give us  2 business days to thoroughly review all your results before contacting the  office for clarification. Should we see a critical lab result, you will be contacted sooner.   If You Need Anything After Your Visit  If you have any questions or concerns for your doctor, please call our main line at [phone] If no one answers, please leave a voicemail as directed and we will return your call as soon as possible. Messages left after 4 pm will be answered the following business day.   You may also send us  a message via MyChart. We typically respond to MyChart messages within 1-2 business days.  For prescription refills, please ask your pharmacy to contact our office. Our fax number is [phone].  If you have an urgent issue when the clinic is closed that cannot wait until the next business day, you can page your doctor at the number below.    Please note that while we do our best to be available for urgent issues outside of office hours, we are not available 24/7.   If you have an urgent issue and are unable to reach us , you may choose to seek medical care at your doctor's office, retail clinic, urgent care center, or emergency room.  If you have a medical emergency, please immediately call 911 or go to the emergency department. In the event of inclement weather, please call our main line at [phone] for an update on the status of any delays or closures.  Dermatology Medication Tips: Please keep  the boxes that topical medications come in in order to help keep track of the instructions about where and how to use these. Pharmacies typically print the medication instructions only on the boxes and not directly on the medication tubes.   If your medication is too expensive, please contact our office at [phone] or send us  a message through MyChart.   We are unable to tell what your co-pay for medications will be in advance as this is different depending on your insurance coverage. However, we may be able to find a substitute medication at lower cost or fill out  paperwork to get insurance to cover a needed medication.   If a prior authorization is required to get your medication covered by your insurance company, please allow us  1-2 business days to complete this process.  Drug prices often vary depending on where the prescription is filled and some pharmacies may offer cheaper prices.  The website www.goodrx.com contains coupons for medications through different pharmacies. The prices here do not account for what the cost may be with help from insurance (it may be cheaper with your insurance), but the website can give you the price if you did not use any insurance.  - You can print the associated coupon and take it with your prescription to the pharmacy.  - You may also stop by our office during regular business hours and pick up a GoodRx coupon card.  - If you need your prescription sent electronically to a different pharmacy, notify our office through [facility] or by phone at [phone]

## 2023-09-22 ENCOUNTER — Ambulatory Visit (INDEPENDENT_AMBULATORY_CARE_PROVIDER_SITE_OTHER): Admitting: Dermatology

## 2023-09-22 ENCOUNTER — Encounter: Payer: Self-pay | Admitting: Dermatology

## 2023-09-22 VITALS — BP 114/72 | HR 85

## 2023-09-22 DIAGNOSIS — L219 Seborrheic dermatitis, unspecified: Secondary | ICD-10-CM

## 2023-09-22 DIAGNOSIS — B351 Tinea unguium: Secondary | ICD-10-CM

## 2023-09-22 DIAGNOSIS — L710 Perioral dermatitis: Secondary | ICD-10-CM | POA: Diagnosis not present

## 2023-09-22 DIAGNOSIS — L819 Disorder of pigmentation, unspecified: Secondary | ICD-10-CM | POA: Diagnosis not present

## 2023-09-22 MED ORDER — ZORYVE 0.3 % EX FOAM
1.0000 | Freq: Every day | CUTANEOUS | 3 refills | Status: AC
Start: 2023-09-22 — End: ?

## 2023-09-22 MED ORDER — HYDROCORTISONE 2.5 % EX OINT
TOPICAL_OINTMENT | Freq: Two times a day (BID) | CUTANEOUS | 6 refills | Status: AC
Start: 2023-09-22 — End: ?

## 2023-09-22 MED ORDER — JUBLIA 10 % EX SOLN: 1.0000 | Freq: Every day | CUTANEOUS | 11 refills | Status: AC

## 2023-09-22 MED ORDER — FLUOCINOLONE ACETONIDE SCALP 0.01 % EX OIL: 1.0000 | TOPICAL_OIL | CUTANEOUS | 6 refills | Status: AC | PRN

## 2023-09-22 MED ORDER — FLUCONAZOLE 150 MG PO TABS: 150.0000 mg | ORAL_TABLET | ORAL | 0 refills | Status: AC

## 2023-09-22 NOTE — Progress Notes (Signed)
 Follow-Up Visit   Subjective  Catherine Montgomery is a 43 y.o. female who presents for the following: Seb Derm, Nail concern and Spot Check  Patient present today for follow up visit for Seb Derm. Patient was last evaluated on 05/19/23. At this visit patient Catherine  Montgomery and recommended to wash with Vichy. She reports she has not tried  Patient reports sxs were well controlled until about a week ago. Patient denies medication changes.  Patient also has concerns for toe nail fungus and Mole on right thumb. Patient reports she removed her polish in June and noticed her nails bed looked abnormal. She reports the nail itself is thicker than normal & has a yellow tint. Patient reports she has not treated her toenails since the areas appeared. She stated she does typically go to the same place to get her pedicures.  The following portions of the chart were reviewed this encounter and updated as appropriate: medications, allergies, medical history  Review of Systems:  No other skin or systemic complaints except as noted in HPI or Assessment and Plan.  Objective  Well appearing patient in no apparent distress; mood and affect are within normal limits.  A focused examination was performed of the following areas: Scalp  Relevant exam findings are noted in the Assessment and Plan.                       Assessment & Plan    1. Seborrheic Dermatitis and Perioral Dermatitis - Assessment: Patient presents with active and flaring seborrheic dermatitis, which has been aggravated recently. The condition is characterized by dry, itchy, and flaky skin, with severe inflammation noted around the mouth. 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 The perioral dermatitis is likely related to the seborrheic dermatitis. Recent use of clean clothing for oncology purposes may have exacerbated the condition. Previous treatments were ineffective or unavailable due to insurance  coverage issues. - Plan:    Prescribe oral fluconazole  150 mg once weekly for 4 weeks to address yeast overgrowth systemically    Initiate topical regimen with Dermasmooth FS Oil applied to affected areas, leave for a couple of hours, then wash out    Apply Catherine  steroid-free topical Montgomery daily until clear, then twice weekly for prevention    Use hydrocortisone  2.5% ointment for immediate relief    Recommend Avene Cicalfate with copper-zinc peptide complex as the last step of nighttime skincare routine    Continue current skincare routine with CeraVe Sensitive Skin cleanser, La Roche-Posay sunscreen, night cream, and serum    Advise against applying oil to the scalp to avoid feeding the yeast  2. Early Onychomycosis - Assessment: Patient noticed a problem with one toe after a gel pedicure, consistent with early onychomycosis. The condition requires topical treatment and lifestyle modifications to prevent progression and promote nail health. - Plan:    Prescribe Jublia  topical antifungal for the affected toe    Apply with brush underneath the nail for 2-3 months before reassessment    Inform patient it may take 4-6 months for the nail to grow out normally    Advise against gel polish during treatment    Recommend nightly use of salicylic acid moisturizer to remove dead skin    Suggest using a brush in the shower for exfoliation    Send Jublia  prescription to [facility]  3. Brown Macule on Right Thumb - Assessment: A 1mm by 2mm brown macule was observed on  the palmar surface of the right thumb. The lesion appears to be a lenticle based on clinical examination. No immediate intervention is required, but monitoring for changes is recommended. - Plan:    Monitor the lesion for changes    No biopsy needed at this time    Advise patient to avoid traumatizing the area    Reassess during follow-up visit  Follow-up in a couple of months to assess response to treatment  modifications. SEBORRHEIC DERMATITIS   Related Medications fluconazole  (DIFLUCAN ) 150 MG tablet Take 1 tablet (150 mg total) by mouth once a week. Fluocinolone  Acetonide Scalp 0.01 % OIL Apply 1 Application topically as needed. Apply to the scalp 2-24 hours prior to washing hair as a oil treatment hydrocortisone  2.5 % ointment Apply topically 2 (two) times daily. Apply 2 times daily for 10 days to the face then STOP Roflumilast  (Catherine ) 0.3 % Montgomery Apply 1 Application topically daily. ONYCHOMYCOSIS   Related Medications fluconazole  (DIFLUCAN ) 150 MG tablet Take 1 tablet (150 mg total) by mouth once a week. Efinaconazole  (JUBLIA ) 10 % SOLN Apply 1 application  topically daily. Apply to toenails nightly  Return in about 6 months (around 03/21/2024) for Seborrheic Dermatitis F/U.  I, Jetta Ager, am acting as Neurosurgeon for [employer], DO.  Documentation: I have reviewed the above documentation for accuracy and completeness, and I agree with the above.  Delon Lenis, DO

## 2023-09-22 NOTE — Patient Instructions (Addendum)
 Date: Mon Sep 22 2023  Hello Catherine Montgomery,  Thank you for visiting today. Here is a summary of the key instructions:  - Medications:   - Take fluconazole  150 mg tablet once a week for 4 weeks   - Apply Dermasmooth FS Oil to affected areas, leave for a couple hours, then wash out   - Use Zoryve  foam daily until clear, then twice a week for prevention   - Apply hydrocortisone  2.5% ointment for immediate relief   - Apply Avene Cicalfate as the last step at night   - Apply Jublia  to affected toenail with the brush for 2-3 months  - Skin Care:   - Use CeraVe Sensitive Skin for cleansing   - Apply La Roche-Posay sunscreen during the day   - Use night cream and serum as usual   - Avoid putting oil on your scalp   - For pedicures, use a salicylic acid moisturizer nightly   - In the shower, use a brush to exfoliate feet  - Follow-up:   - Return for a follow-up appointment in a couple of months  - Other Instructions:   - Wear clean clothing to avoid irritation   - Avoid gel polish during treatment for toenail  Please reach out if you have any questions or concerns.  Warm regards,  Dr. Delon Lenis Dermatology     Important Information   Due to recent changes in healthcare laws, you may see results of your pathology and/or laboratory studies on MyChart before the doctors have had a chance to review them. We understand that in some cases there may be results that are confusing or concerning to you. Please understand that not all results are received at the same time and often the doctors may need to interpret multiple results in order to provide you with the best plan of care or course of treatment. Therefore, we ask that you please give us  2 business days to thoroughly review all your results before contacting the office for clarification. Should we see a critical lab result, you will be contacted sooner.     If You Need Anything After Your Visit   If you have any questions or concerns  for your doctor, please call our main line at [phone]. If no one answers, please leave a voicemail as directed and we will return your call as soon as possible. Messages left after 4 pm will be answered the following business day.    You may also send us  a message via MyChart. We typically respond to MyChart messages within 1-2 business days.  For prescription refills, please ask your pharmacy to contact our office. Our fax number is [phone].  If you have an urgent issue when the clinic is closed that cannot wait until the next business day, you can page your doctor at the number below.     Please note that while we do our best to be available for urgent issues outside of office hours, we are not available 24/7.    If you have an urgent issue and are unable to reach us , you may choose to seek medical care at your doctor's office, retail clinic, urgent care center, or emergency room.   If you have a medical emergency, please immediately call 911 or go to the emergency department. In the event of inclement weather, please call our main line at [phone] for an update on the status of any delays or closures.  Dermatology Medication Tips: Please keep the boxes that  topical medications come in in order to help keep track of the instructions about where and how to use these. Pharmacies typically print the medication instructions only on the boxes and not directly on the medication tubes.   If your medication is too expensive, please contact our office at [phone] or send us  a message through MyChart.    We are unable to tell what your co-pay for medications will be in advance as this is different depending on your insurance coverage. However, we may be able to find a substitute medication at lower cost or fill out paperwork to get insurance to cover a needed medication.    If a prior authorization is required to get your medication covered by your insurance company, please allow us   1-2 business days to complete this process.   Drug prices often vary depending on where the prescription is filled and some pharmacies may offer cheaper prices.   The website www.goodrx.com contains coupons for medications through different pharmacies. The prices here do not account for what the cost may be with help from insurance (it may be cheaper with your insurance), but the website can give you the price if you did not use any insurance.  - You can print the associated coupon and take it with your prescription to the pharmacy.  - You may also stop by our office during regular business hours and pick up a GoodRx coupon card.  - If you need your prescription sent electronically to a different pharmacy, notify our office through [facility] or by phone at [phone]

## 2023-11-12 IMAGING — DX DG CHEST 2V
2 series · 2 of 2 positions shown · non-contrast
Comparison: Chest x-ray dated December 11, 2005.

CLINICAL DATA: Productive cough.

EXAM:
CHEST - 2 VIEW

[chest pa]
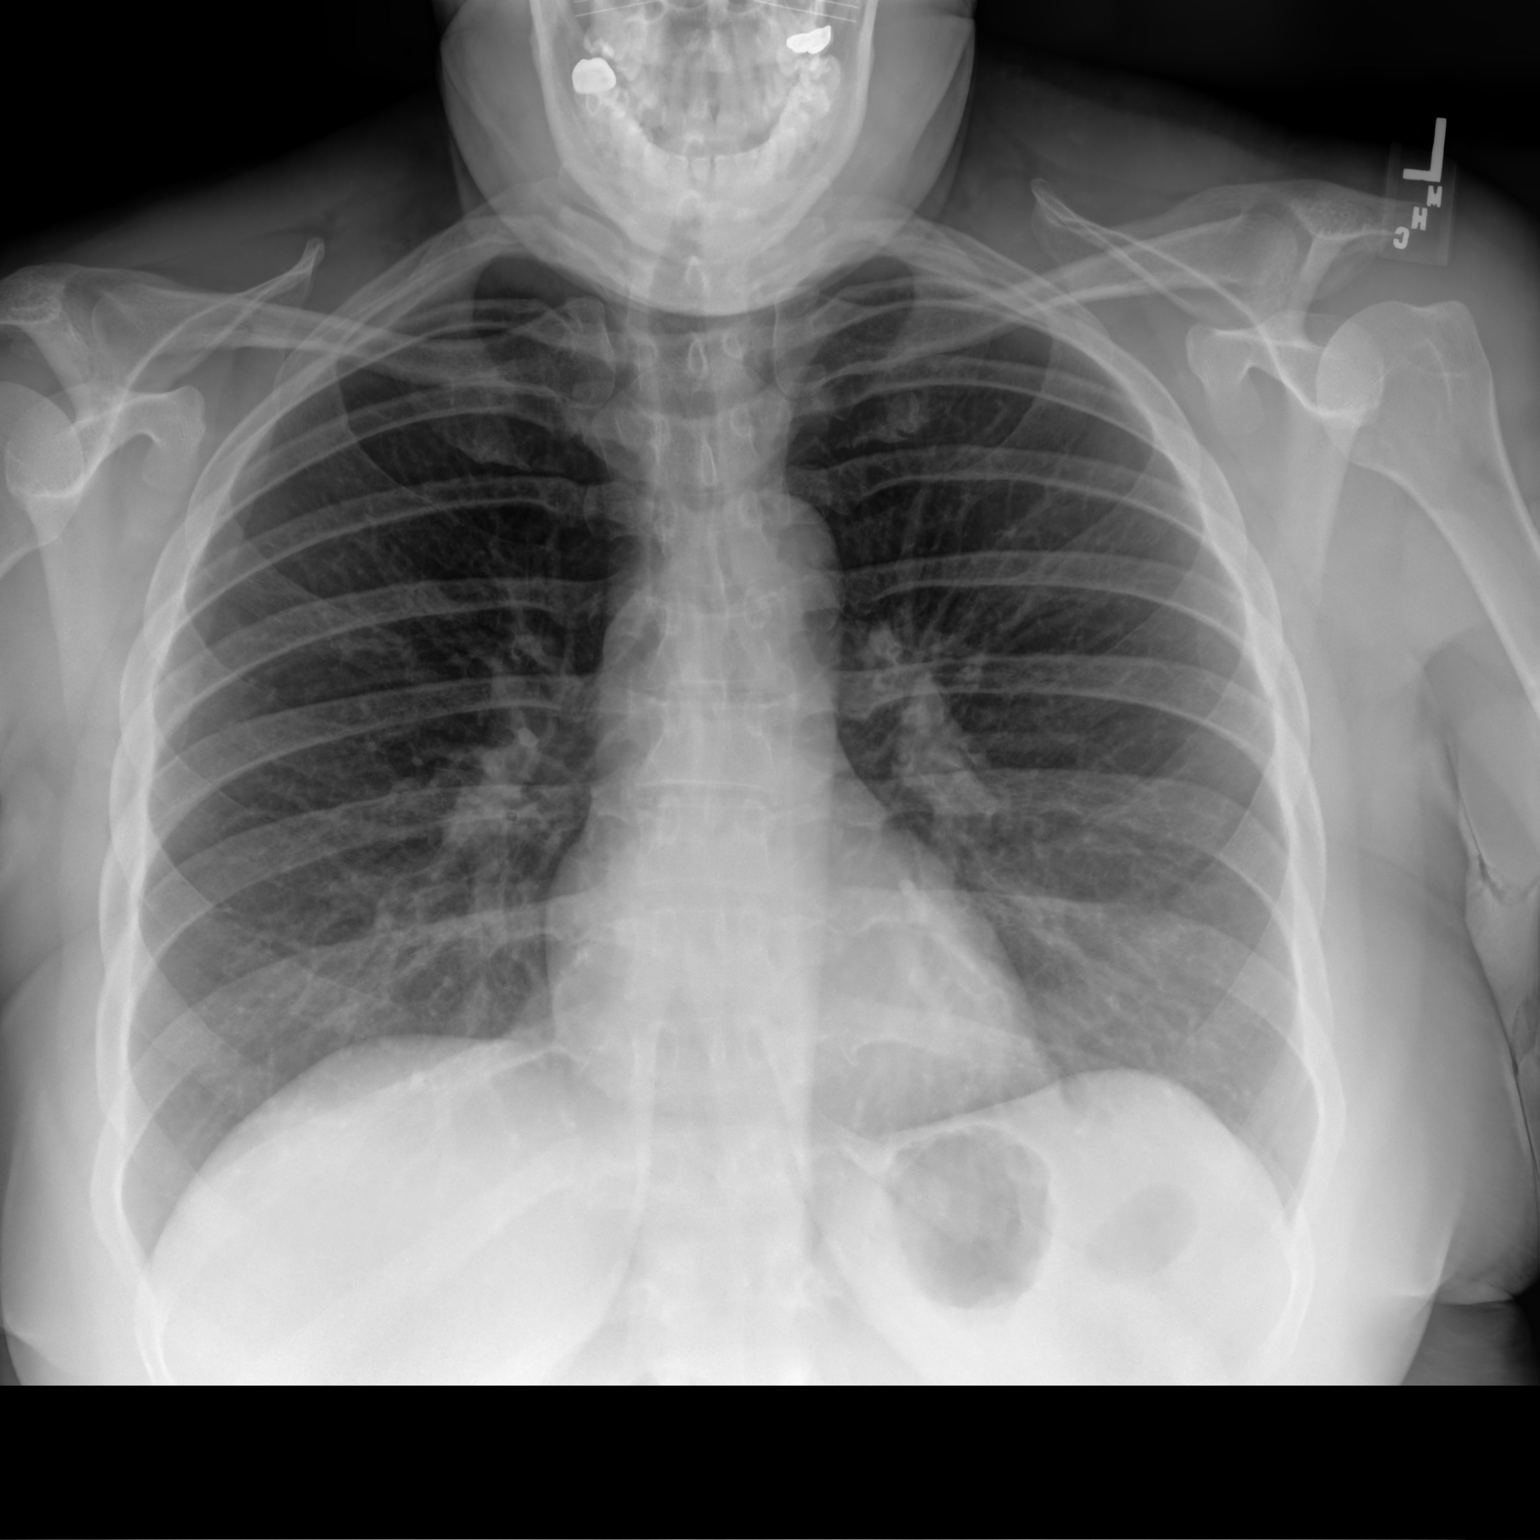

[chest lat]
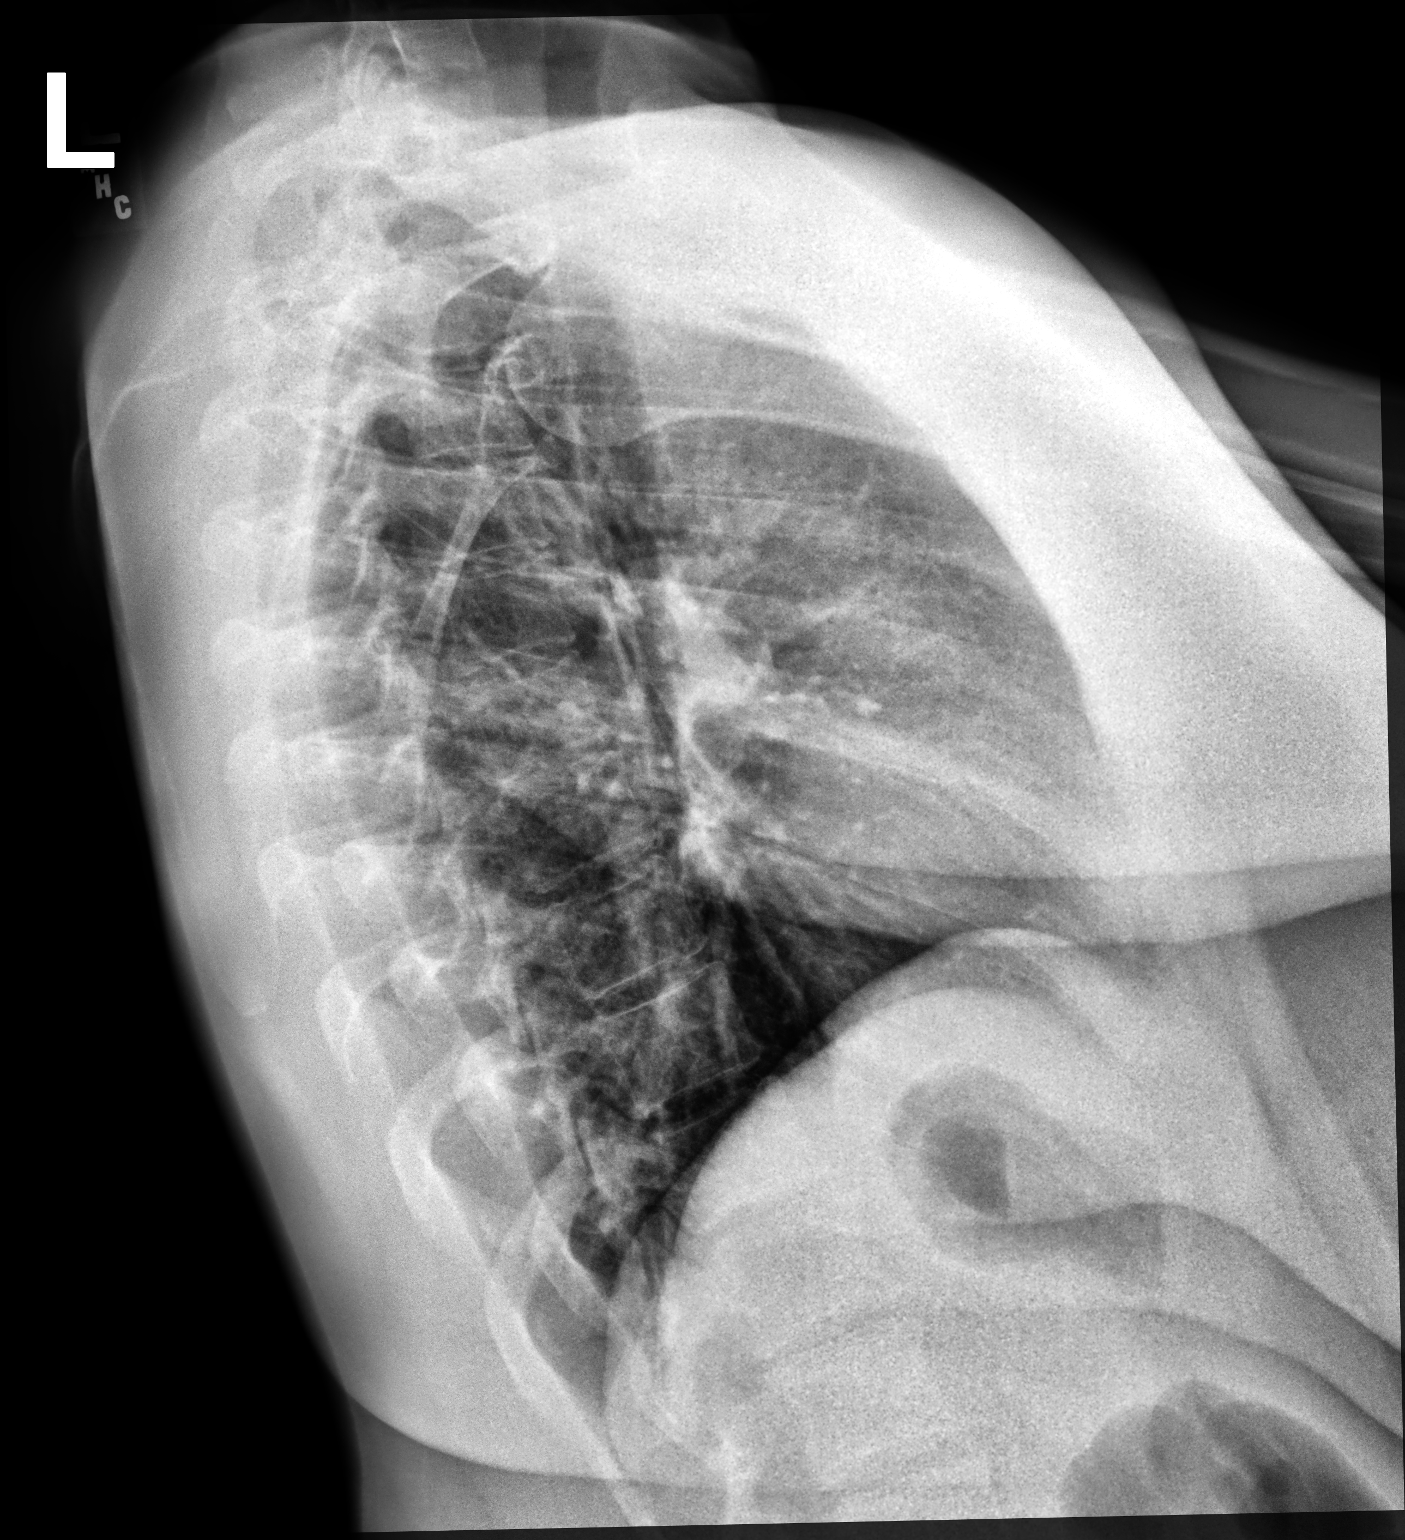

[2 of 2 positions shown; findings below may reference images not displayed]

FINDINGS: The heart size and mediastinal contours are within normal limits.
Both lungs are clear. The visualized skeletal structures are
unremarkable.
IMPRESSION: No active cardiopulmonary disease.

## 2023-12-05 ENCOUNTER — Ambulatory Visit: Admission: EM | Admit: 2023-12-05 | Discharge: 2023-12-05 | Disposition: A | Discharge status: Home / Self Care

## 2023-12-05 DIAGNOSIS — H6501 Acute serous otitis media, right ear: Secondary | ICD-10-CM

## 2023-12-05 MED ORDER — CETIRIZINE-PSEUDOEPHEDRINE ER 5-120 MG PO TB12: 1.0000 | ORAL_TABLET | Freq: Every day | ORAL | 0 refills | Status: AC

## 2023-12-05 MED ORDER — AZELASTINE HCL 0.1 % NA SOLN: 1.0000 | Freq: Two times a day (BID) | NASAL | 0 refills | Status: AC

## 2023-12-05 MED ORDER — PREDNISONE 20 MG PO TABS: 40.0000 mg | ORAL_TABLET | Freq: Every day | ORAL | 0 refills | Status: AC

## 2023-12-05 NOTE — Discharge Instructions (Addendum)
 Your symptoms today are caused by fluid trapped behind your eardrum, which is called serous otitis media. This often happens after a cold or sinus infection and can cause muffled hearing, pressure, and discomfort. It is not a bacterial ear infection, so antibiotics will not help.   You were started on Flonase once each morning and Astelin twice a day to help open the eustachian tube and allow the fluid to drain. A refill for Astelin was provided. You should also take the cetirizine-pseudoephedrine once daily. Use the nasal sprays exactly as instructed: blow your nose first, keep your head upright, gently spray into the nostril while aiming slightly outward toward the ear, and breathe in lightly so the medicine stays in the nose. You should not taste or feel the medication going down your throat.   Avoid placing anything inside your ear, including Q-tips. This condition usually improves gradually over several days. Follow up with your primary care provider or an ENT specialist if you are not starting to feel better within 48 hours of using the medications correctly or if your hearing does not improve over time. Go to the emergency department right away if you develop a high fever, severe headache, complete hearing loss, bleeding from the ear, or worsening or severe pain.

## 2023-12-05 NOTE — ED Triage Notes (Signed)
 Pt c/o right ear pressure, fatigue, and loss of balance x1-2weeks  Pt states that she was seen by her pcp and was given medication for a bilateral ear infection  Pt states that her symptoms have not gotten better and she is now having ear pain.

## 2023-12-05 NOTE — ED Provider Notes (Signed)
 CAY RALPH PELT    CSN: [id number] Arrival date & time: 12/05/23  1527      History   Chief Complaint Chief Complaint  Patient presents with   Ear Pain    HPI Catherine Montgomery is a 43 y.o. female.   Discussed the use of AI scribe software for clinical note transcription with the patient, who gave verbal consent to proceed.   The patient presents with right ear symptoms described as a sensation of something in the ear along with a whooshing noise. She reports being seen at urgent care last week for a double ear infection and completed a Z-pack from 11/19 to 11/23 without improvement. She attempted Debrox today but was unable to remove any wax and experienced pain with use. Initially, her illness began with congestion, cough, nasal drainage, and changes in her voice, which produced significant mucus; these symptoms have been improving. She had a fever early in the illness that has since resolved. She continues to experience right ear fullness, difficulty hearing, pain, and occasional dizziness. Testing for COVID-19, strep, RSV, and influenza at her prior visit was negative. She was previously prescribed Flonase and Astelin  nasal sprays; she used Flonase today but has run out of Astelin .  The following sections of the patient's history were reviewed and updated as appropriate: allergies, current medications, past family history, past medical history, past social history, past surgical history, and problem list.       Past Medical History:  Diagnosis Date   Anemia    Anxiety    Bell's palsy    Lt side   Environmental allergies    Obesity (BMI 35.0-39.9 without comorbidity)    Vulvar intraepithelial neoplasia (VIN) grade 3     Patient Active Problem List   Diagnosis Date Noted   Attention deficit hyperactivity disorder (ADHD) 01/07/2023   Hyperhidrosis 01/07/2023   Allergy 06/11/2020   Prediabetes 05/10/2019   Adjustment reaction with anxiety and depression  11/09/2018   At high risk for breast cancer 01/29/2016   Chronic seasonal allergic rhinitis due to pollen 01/10/2016   Obesity, unspecified 01/10/2016   Status post cesarean section 04/18/2015   Lower urinary tract infectious disease 07/26/2012   Routine health maintenance 01/18/2011    Past Surgical History:  Procedure Laterality Date   CESAREAN SECTION     CESAREAN SECTION N/A 04/18/2015   Procedure: CESAREAN SECTION;  Surgeon: Garnette JONETTA Mace, MD;  Location: [facility] ORS;  Service: Obstetrics;  Laterality: N/A;   EYE SURGERY  in college   VULVA /PERINEUM BIOPSY     VIN 3    OB History     Gravida  4   Para  2   Term  2   Preterm      AB  2   Living  2      SAB      IAB  2   Ectopic      Multiple  0   Live Births  2            Home Medications    Prior to Admission medications   Medication Sig Start Date End Date Taking? Authorizing Provider  albuterol  (VENTOLIN  HFA) 108 (90 Base) MCG/ACT inhaler Inhale 1-2 puffs into the lungs every 6 (six) hours as needed for wheezing or shortness of breath. 05/21/21  Yes Corlis Burnard DEL, NP  cetirizine -pseudoephedrine  (ZYRTEC -D) 5-120 MG tablet Take 1 tablet by mouth daily with breakfast for 10 days. 12/05/23 12/15/23 Yes Iola Lukes, FNP  clobetasol  (TEMOVATE ) 0.05 % external solution Apply 1 Application topically 2 (two) times daily. Apply to affected areas of scalp x 2 weeks as needed. 05/02/22  Yes Alm Delon SAILOR, DO  clobetasol  ointment (TEMOVATE ) 0.05 % Apply 1 Application topically 2 (two) times daily. Bid to aa alopecia scalp and psoriasis scalp, avoid face, groin, axilla 03/28/22  Yes Alm Delon SAILOR, DO  clobetasol  ointment (TEMOVATE ) 0.05 % Apply 1 Application topically 2 (two) times daily. Apply twice a day for up to two weeks 05/20/22  Yes Alm Delon SAILOR, DO  Efinaconazole  (JUBLIA ) 10 % SOLN Apply 1 application  topically daily. Apply to toenails nightly 09/22/23  Yes Alm Delon SAILOR, DO   Fluocinolone  Acetonide Scalp 0.01 % OIL Apply 1 Application topically as needed. Apply to the scalp 2-24 hours prior to washing hair as a oil treatment 09/22/23  Yes Alm Delon SAILOR, DO  fluticasone [facility]) 50 MCG/ACT nasal spray Place 2 sprays into both nostrils daily.   Yes [provider]  Glycopyrronium Tosylate  (QBREXZA ) 2.4 % PADS Apply 1 Pad topically at bedtime. 05/02/22  Yes Alm Delon SAILOR, DO  Glycopyrronium Tosylate  2.4 % PADS Apply topically.   Yes [provider]  hydrocortisone  2.5 % ointment Apply topically 2 (two) times daily. Apply 2 times daily for 10 days to the face then STOP 09/22/23  Yes Alm Delon SAILOR, DO  ketoconazole (NIZORAL) 2 % shampoo USE FOR 1-2WEEKS ALLOW TO REMAIN ON DRY SCALP FOR THEN WASH OUT FOLLOWED BY REG SHAMPOO 07/20/16  Yes [provider]  lidocaine  (XYLOCAINE ) 5 % ointment Apply 1 Application topically as needed. Apply to areas being treated one hour prior to procedure 05/15/22  Yes Alm Delon SAILOR, DO  Multiple Vitamin (MULTIVITAMIN) tablet Take 1 tablet by mouth daily.   Yes [provider]  predniSONE  (DELTASONE ) 20 MG tablet Take 2 tablets (40 mg total) by mouth daily for 5 days. 12/05/23 12/10/23 Yes Iola Lukes, FNP  Roflumilast  (ZORYVE ) 0.3 % FOAM Apply 1 Application topically daily. 09/22/23  Yes Alm Delon SAILOR, DO  sertraline (ZOLOFT) 50 MG tablet Take 50 mg by mouth daily.   Yes [provider]  ZEPBOUND 12.5 MG/0.5ML Pen  10/20/23  Yes [provider]  azelastine (ASTELIN) 0.1 % nasal spray Place 1 spray into both nostrils 2 (two) times daily. Use in each nostril as directed 12/05/23   Iola Lukes, FNP  fluconazole  (DIFLUCAN ) 150 MG tablet Take 1 tablet (150 mg total) by mouth once a week. 09/22/23   Alm Delon SAILOR, DO  lidocaine  (XYLOCAINE ) 2 % solution Use as directed 15 mLs in the mouth or throat as needed for mouth pain. 05/21/21   Tate, Kelly H, NP  Olopatadine   HCl 0.2 % SOLN Apply 1 drop to eye daily as needed. 04/17/21   Jarold Medici, MD  Semaglutide -Weight Management (WEGOVY) 0.5 MG/0.5ML SOAJ INJECT 0.5 MLS (0.5 MG TOTAL) SUBCUTANEOUSLY ONCE A WEEK 03/08/20   [provider]  lisdexamfetamine (VYVANSE ) 50 MG capsule 1 capsule by mouth daily 03/12/22 03/12/22      Family History Family History  Problem Relation Age of Onset   Hypertension Mother    Diabetes Father    Hypertension Father    Diabetes Sister        not anymore   Diabetes Maternal Grandmother    Diabetes Maternal Grandfather    Breast cancer Paternal Grandmother        33's   Breast cancer Paternal Uncle 74  Colon cancer Paternal Uncle    Breast cancer Maternal Aunt 47    Social History Social History   Tobacco Use   Smoking status: Never    Passive exposure: Never   Smokeless tobacco: Never  Vaping Use   Vaping status: Never Used  Substance Use Topics   Alcohol use: No   Drug use: No     Allergies   Patient has no known allergies.   Review of Systems Review of Systems  Constitutional:  Negative for fever.  HENT:  Positive for congestion, ear pain, rhinorrhea, sore throat and voice change. Negative for postnasal drip.   Respiratory:  Positive for cough.   Gastrointestinal:  Negative for diarrhea, nausea and vomiting.  Musculoskeletal:  Negative for myalgias.  Neurological:  Positive for dizziness. Negative for headaches.  All other systems reviewed and are negative.    Physical Exam Triage Vital Signs ED Triage Vitals  Encounter Vitals Group     BP 12/05/23 1702 121/83     Girls Systolic BP Percentile --      Girls Diastolic BP Percentile --      Boys Systolic BP Percentile --      Boys Diastolic BP Percentile --      Pulse Rate 12/05/23 1702 83     Resp 12/05/23 1702 18     Temp 12/05/23 1702 97.6 F (36.4 C)     Temp src --      SpO2 12/05/23 1702 99 %     Weight --      Height --      Head Circumference --      Peak Flow --       Pain Score 12/05/23 1727 6     Pain Loc --      Pain Education --      Exclude from Growth Chart --    No data found.  Updated Vital Signs BP 121/83   Pulse 83   Temp 97.6 F (36.4 C)   Resp 18   LMP 12/02/2023   SpO2 99%   Visual Acuity Right Eye Distance:   Left Eye Distance:   Bilateral Distance:    Right Eye Near:   Left Eye Near:    Bilateral Near:     Physical Exam Vitals reviewed.  Constitutional:      General: She is awake. She is not in acute distress.    Appearance: Normal appearance. She is well-developed. She is not ill-appearing, toxic-appearing or diaphoretic.  HENT:     Head: Normocephalic.     Right Ear: Hearing normal. Tenderness present. No drainage or swelling. A middle ear effusion is present. Tympanic membrane is not erythematous.     Left Ear: Hearing, tympanic membrane, ear canal and external ear normal. No drainage, swelling or tenderness.  No middle ear effusion. Tympanic membrane is not erythematous.     Nose: Congestion present. No rhinorrhea.     Mouth/Throat:     Lips: Pink.     Mouth: Mucous membranes are moist.     Pharynx: Oropharynx is clear. Uvula midline. No pharyngeal swelling, oropharyngeal exudate, posterior oropharyngeal erythema or uvula swelling.     Tonsils: No tonsillar exudate or tonsillar abscesses.  Eyes:     General: Vision grossly intact.     Conjunctiva/sclera: Conjunctivae normal.  Cardiovascular:     Rate and Rhythm: Normal rate.     Heart sounds: Normal heart sounds.  Pulmonary:     Effort: Pulmonary effort is normal. No  tachypnea or respiratory distress.     Breath sounds: Normal breath sounds and air entry.  Musculoskeletal:        General: Normal range of motion.     Cervical back: Normal range of motion and neck supple.  Lymphadenopathy:     Cervical: No cervical adenopathy.  Skin:    General: Skin is warm and dry.  Neurological:     General: No focal deficit present.     Mental Status: She is alert and  oriented to person, place, and time.  Psychiatric:        Behavior: Behavior is cooperative.      UC Treatments / Results  Labs (all labs ordered are listed, but only abnormal results are displayed) Labs Reviewed - No data to display  EKG   Radiology No results found.  Procedures Procedures (including critical care time)  Medications Ordered in UC Medications - No data to display  Initial Impression / Assessment and Plan / UC Course  I have reviewed the triage vital signs and the nursing notes.  Pertinent labs & imaging results that were available during my care of the patient were reviewed by me and considered in my medical decision making (see chart for details).     The patient presents with right ear fullness, a whooshing sound, decreased hearing, and pain following a recent upper respiratory infection that was treated with a Z-pack without improvement. Examination shows a clear ear canal and normal-appearing tympanic membrane with visible fluid behind the eardrum, consistent with serous otitis media due to eustachian tube dysfunction rather than bacterial infection. This explains her persistent fullness, discomfort, and hearing difficulty, and also why antibiotics and Debrox have been ineffective. Treatment includes daily Flonase, Astelin in the morning and evening with a refill provided, and cetirizine-pseudoephedrine once daily. Proper nasal spray technique was reviewed, and she was advised to avoid putting anything in the ear. She was instructed to follow up with her primary care provider or ENT if symptoms fail to improve after 48 hours of correct use of medications or if they worsen. Emergency evaluation was advised for high fever, severe headache, complete hearing loss, bleeding from the ear, or severe escalating pain.  Today's evaluation has revealed no signs of a dangerous process. Discussed diagnosis with patient and/or guardian. Patient and/or guardian aware of their  diagnosis, possible red flag symptoms to watch out for and need for close follow up. Patient and/or guardian understands verbal and written discharge instructions. Patient and/or guardian comfortable with plan and disposition.  Patient and/or guardian has a clear mental status at this time, good insight into illness (after discussion and teaching) and has clear judgment to make decisions regarding their care  Documentation was completed with the aid of voice recognition software. Transcription may contain typographical errors.  Final Clinical Impressions(s) / UC Diagnoses   Final diagnoses:  Acute serous otitis media of right ear without rupture     Discharge Instructions      Your symptoms today are caused by fluid trapped behind your eardrum, which is called serous otitis media. This often happens after a cold or sinus infection and can cause muffled hearing, pressure, and discomfort. It is not a bacterial ear infection, so antibiotics will not help.   You were started on Flonase once each morning and Astelin twice a day to help open the eustachian tube and allow the fluid to drain. A refill for Astelin was provided. You should also take the cetirizine-pseudoephedrine once daily. Use the  nasal sprays exactly as instructed: blow your nose first, keep your head upright, gently spray into the nostril while aiming slightly outward toward the ear, and breathe in lightly so the medicine stays in the nose. You should not taste or feel the medication going down your throat.   Avoid placing anything inside your ear, including Q-tips. This condition usually improves gradually over several days. Follow up with your primary care provider or an ENT specialist if you are not starting to feel better within 48 hours of using the medications correctly or if your hearing does not improve over time. Go to the emergency department right away if you develop a high fever, severe headache, complete hearing loss,  bleeding from the ear, or worsening or severe pain.     ED Prescriptions     Medication Sig Dispense Auth. Provider   azelastine  (ASTELIN ) 0.1 % nasal spray Place 1 spray into both nostrils 2 (two) times daily. Use in each nostril as directed 30 mL Iola Lukes, FNP   cetirizine -pseudoephedrine  (ZYRTEC -D) 5-120 MG tablet Take 1 tablet by mouth daily with breakfast for 10 days. 10 tablet Iola Lukes, FNP   predniSONE  (DELTASONE ) 20 MG tablet Take 2 tablets (40 mg total) by mouth daily for 5 days. 10 tablet Iola Lukes, FNP      PDMP not reviewed this encounter.   Iola Lukes, [place] 12/05/23 [phone]

## 2024-03-22 ENCOUNTER — Ambulatory Visit: Admitting: Dermatology
